# Patient Record
Sex: Male | Born: 1990 | Race: White | Hispanic: No | Marital: Married | Smoking: Current every day smoker
Health system: Southern US, Community
[De-identification: ages and names within clinical notes are randomized; demographics above are authoritative.]

## PROBLEM LIST (undated history)

## (undated) DIAGNOSIS — M25569 Pain in unspecified knee: Secondary | ICD-10-CM

## (undated) DIAGNOSIS — M549 Dorsalgia, unspecified: Secondary | ICD-10-CM

## (undated) DIAGNOSIS — T8859XA Other complications of anesthesia, initial encounter: Secondary | ICD-10-CM

## (undated) DIAGNOSIS — Z87442 Personal history of urinary calculi: Secondary | ICD-10-CM

## (undated) DIAGNOSIS — G8929 Other chronic pain: Secondary | ICD-10-CM

## (undated) DIAGNOSIS — N289 Disorder of kidney and ureter, unspecified: Secondary | ICD-10-CM

## (undated) DIAGNOSIS — T4145XA Adverse effect of unspecified anesthetic, initial encounter: Secondary | ICD-10-CM

## (undated) DIAGNOSIS — R7303 Prediabetes: Secondary | ICD-10-CM

## (undated) DIAGNOSIS — M5430 Sciatica, unspecified side: Secondary | ICD-10-CM

## (undated) HISTORY — PX: KNEE ARTHROPLASTY: SHX992

## (undated) HISTORY — PX: ANTERIOR CRUCIATE LIGAMENT REPAIR: SHX115

---
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
## 2008-01-04 ENCOUNTER — Emergency Department (HOSPITAL_COMMUNITY): Admission: EM | Admit: 2008-01-04 | Discharge: 2008-01-04 | Payer: Self-pay | Admitting: Emergency Medicine

## 2008-01-04 ENCOUNTER — Encounter: Payer: Self-pay | Admitting: Orthopedic Surgery

## 2008-01-10 ENCOUNTER — Ambulatory Visit: Payer: Self-pay | Admitting: Orthopedic Surgery

## 2008-01-10 ENCOUNTER — Encounter (INDEPENDENT_AMBULATORY_CARE_PROVIDER_SITE_OTHER): Payer: Self-pay | Admitting: *Deleted

## 2008-01-10 DIAGNOSIS — S83429A Sprain of lateral collateral ligament of unspecified knee, initial encounter: Secondary | ICD-10-CM

## 2008-01-10 DIAGNOSIS — S83419A Sprain of medial collateral ligament of unspecified knee, initial encounter: Secondary | ICD-10-CM

## 2008-01-10 DIAGNOSIS — S83509A Sprain of unspecified cruciate ligament of unspecified knee, initial encounter: Secondary | ICD-10-CM | POA: Insufficient documentation

## 2008-01-10 DIAGNOSIS — S83289A Other tear of lateral meniscus, current injury, unspecified knee, initial encounter: Secondary | ICD-10-CM

## 2008-01-11 ENCOUNTER — Encounter: Payer: Self-pay | Admitting: Orthopedic Surgery

## 2008-01-11 ENCOUNTER — Encounter (HOSPITAL_COMMUNITY): Admission: RE | Admit: 2008-01-11 | Discharge: 2008-02-10 | Payer: Self-pay | Admitting: Orthopedic Surgery

## 2008-01-18 ENCOUNTER — Ambulatory Visit: Payer: Self-pay | Admitting: Orthopedic Surgery

## 2008-02-02 ENCOUNTER — Ambulatory Visit: Payer: Self-pay | Admitting: Orthopedic Surgery

## 2008-02-02 ENCOUNTER — Observation Stay (HOSPITAL_COMMUNITY): Admission: RE | Admit: 2008-02-02 | Discharge: 2008-02-04 | Payer: Self-pay | Admitting: Orthopedic Surgery

## 2008-02-04 ENCOUNTER — Encounter: Payer: Self-pay | Admitting: Orthopedic Surgery

## 2008-02-06 ENCOUNTER — Ambulatory Visit: Payer: Self-pay | Admitting: Orthopedic Surgery

## 2008-02-07 ENCOUNTER — Encounter: Payer: Self-pay | Admitting: Orthopedic Surgery

## 2008-02-09 ENCOUNTER — Encounter (HOSPITAL_COMMUNITY): Admission: RE | Admit: 2008-02-09 | Discharge: 2008-03-10 | Payer: Self-pay | Admitting: Orthopedic Surgery

## 2008-02-09 ENCOUNTER — Encounter: Payer: Self-pay | Admitting: Orthopedic Surgery

## 2008-02-13 ENCOUNTER — Encounter: Payer: Self-pay | Admitting: Orthopedic Surgery

## 2008-02-27 ENCOUNTER — Ambulatory Visit: Payer: Self-pay | Admitting: Orthopedic Surgery

## 2008-03-11 ENCOUNTER — Encounter (HOSPITAL_COMMUNITY): Admission: RE | Admit: 2008-03-11 | Discharge: 2008-04-10 | Payer: Self-pay | Admitting: Orthopedic Surgery

## 2008-03-13 ENCOUNTER — Ambulatory Visit: Payer: Self-pay | Admitting: Orthopedic Surgery

## 2008-03-18 ENCOUNTER — Encounter: Payer: Self-pay | Admitting: Orthopedic Surgery

## 2008-04-12 ENCOUNTER — Encounter (HOSPITAL_COMMUNITY): Admission: RE | Admit: 2008-04-12 | Discharge: 2008-05-12 | Payer: Self-pay | Admitting: Orthopedic Surgery

## 2008-04-26 ENCOUNTER — Encounter: Payer: Self-pay | Admitting: Orthopedic Surgery

## 2008-04-29 ENCOUNTER — Ambulatory Visit: Payer: Self-pay | Admitting: Orthopedic Surgery

## 2008-05-14 ENCOUNTER — Encounter: Admission: RE | Admit: 2008-05-14 | Discharge: 2008-06-13 | Payer: Self-pay | Admitting: Orthopedic Surgery

## 2008-06-13 ENCOUNTER — Encounter: Payer: Self-pay | Admitting: Orthopedic Surgery

## 2008-07-05 ENCOUNTER — Encounter: Payer: Self-pay | Admitting: Orthopedic Surgery

## 2008-12-03 ENCOUNTER — Telehealth: Payer: Self-pay | Admitting: Orthopedic Surgery

## 2011-03-23 NOTE — H&P (Signed)
NAME:  Cory Davis, Cory Davis NO.:  [id number]   MEDICAL RECORD NO.:  [id number]          PATIENT TYPE:  AMB   LOCATION:  DAY                           FACILITY:  [facility]   PHYSICIAN:  Vickki Hearing, M.D.DATE OF BIRTH:  Dec 26, 1990   DATE OF ADMISSION:  DATE OF DISCHARGE:  LH                              HISTORY & PHYSICAL   CHIEF COMPLAINT:  Right knee torn anterior cruciate ligament.   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  He had a non  contact valgus load injury with immediate pain, swelling and audible  popping.  He could not ambulate on the knee and could not continue his  playing.  He was treated with physical therapy, bracing and regained his  range of motion and is now ready for surgery.   His MRI was on January 04, 2008, and read torn ACL, complex tear of the  posterior horn of the lateral meniscus.  Although clinically not  apparent on examination, his MRI also indicated impaction injuries to  the posterior lateral tibial plateau with bone bruises in the lateral  femoral condyle consistent with ACL tear.  There is low-level edema in  the posterior aspect the medial tibial plateau.  Moderate knee effusion.  There was abnormal signal in the posterior cruciate ligament.  There was  surrounding edema and amorphous soft tissue adjacent to the popliteus  tendon posterolateral corner including arcuate ligament and injury to  the popliteal fibular ligament that could not be excluded.   CLINICAL EXAM:  There are no lateral injuries.  There was some  tenderness in the lateral compartment, but nothing to suggest the  findings that were seen on the MRI other than the torn ACL and lateral  meniscal tear.  This will be evaluated with a complete exam under  anesthesia.   Past family history, social history and review of systems is as follows.  The patient has no known drug  allergies.  No medical problems.  No  previous surgery.  Negative family history.  Negative social history.  Review of systems negative times 10.   PHYSICAL EXAMINATION:  GENERAL:  Reveals a well-developed, well-  nourished athletic male with large body habitus who has no deformities,  normal grooming.  Gait is heel-toe bilaterally.  SKIN:  Intact with no scars, lesions, rashes, cafe au lait spots or  bruising.  CARDIOVASCULAR:  No swelling or varicose veins are noted.  Pulses and  temperature normal without edema or tenderness.  NEUROLOGICAL:  Motor exam shows normal strength in the quadriceps and  hamstrings as well as the ankle dorsiflexors and plantar flexors.  Reflexes are normal and symmetric in the patellar tendon and Achilles  tendon.  EXTREMITIES:  His upper extremity exam shows normal range of motion,  strength, stability and alignment.  The right knee examination was noted  for tenderness at the fibular attachment of the lateral collateral  ligament without any bruising or ecchymosis and no swelling.  There was  tenderness also on the lateral joint  line.  His flexion was 120 degrees  actively with -10 degrees extension.  Passive range of motion was -2 to  120.  He had a positive anterior drawer sign grade 2 and negative  posterior drawer sign, negative external rotation recurvatum sign,  positive Lachman sign grade 2, a pivot shift sign could not be exhibited  due to muscle guarding.  Had a negative sag sign.  The MCL and LCL were  stable at 0 and 30 degrees on two evaluations.  Dial test was negative  at 30 and 90 degrees.  Left knee was normal.   IMPRESSION:  1. Torn ACL (844.2), right knee.  2. Tear lateral meniscus (836.1).   The lateral collateral ligament complex will have to be reevaluated at  the time of surgery, but it appears to be clinically insignificant  despite the MRI findings.  Plan is to do a bone patellar tendon bone  anterior cruciate ligament  reconstruction of the right knee.   Informed consent was completed in the office.  We discussed the need to  do rehabilitation for the surgery to be successful.  The patient needs  to be highly motivated and participate as instructed.  Bleeding,  infection, neurovascular injury has been discussed.  The possibility of  having to do a lateral incision to repair the structures noted has been  discussed.  Patellofemoral pain has been discussed.  Re-rupture rates  and re-rupture possibility has been discussed.  Stiffness and lack of  extension has been discussed.   The patient agrees to surgery.      Vickki Hearing, M.D.  Electronically Signed     SEH/MEDQ  D:  02/01/2008  T:  02/01/2008  Job:  [id number]   cc:   [facility]

## 2011-03-23 NOTE — Discharge Summary (Signed)
NAME:  MERL, BOMMARITO NO.:  [id number]   MEDICAL RECORD NO.:  [id number]          PATIENT TYPE:  OBV   LOCATION:  A322                          FACILITY:  [facility]   PHYSICIAN:  Vickki Hearing, M.D.DATE OF BIRTH:  Mar 22, 1991   DATE OF ADMISSION:  02/02/2008  DATE OF DISCHARGE:  03/29/2009LH                               DISCHARGE SUMMARY   ADMITTING DIAGNOSIS:  ACL (anterior cruciate ligament) and lateral  meniscal tear right knee.   DISCHARGE DIAGNOSIS:  ACL (anterior cruciate ligament) and lateral  meniscal tear right knee.   HISTORY:  An 20 year old male was injured January 04, 2008, playing  basketball.  Had a noncontact valgus load injury with immediate pain,  swelling, popping and inability to continue participation in sporting  event.  MRI showed torn ACL, complex tear of the posterior horn lateral  meniscus and suggestions of posterolateral corner and lateral collateral  ligament injuries which did not pan out during clinical exam under  anesthesia.   The patient was admitted for tertiary diagnosis of pain and poor pain  control after surgery.   Pain now under control with Vicodin, ibuprofen.   HOSPITAL COURSE:  After admission on the day of surgery February 02, 2008,  had an uncomplicated bone patellar tendon bone autograft ACL  reconstruction with a partial lateral meniscectomy.  He had an exam  under anesthesia which revealed normal collateral ligament and  posterolateral structures.  There was a grade II Lachman and grade II  pivot.  His lateral meniscus was shredded and torn as was his ACL.  There was a medial plica, patellofemoral joint and medial structures had  normal articular surfaces and no other damage was noted.   After surgery he went to the PACU.  We could not get his pain under  control and therefore he was admitted.   On postop day #1 he was still having significant pain described as  throbbing.  Ibuprofen was added and his pain  was then controlled.  He  was ambulatory with crutches and a brace and was allowed to be  discharged on postop day #2.   DISCHARGE MEDICINES:  1. Phenergan 20 q.6 h. p.r.n. for pain.  2. Vicodin 1-2 q.4 h. p.r.n. for pain.  3. Ibuprofen 800 mg q.6-8 h. p.r.n. for pain.   The patient will follow up on Tuesday, March 31.  At that time we will  do a dressing change, start our order is physical therapy and continuous  postop care from there.      Vickki Hearing, M.D.  Electronically Signed     SEH/MEDQ  D:  02/04/2008  T:  02/04/2008  Job:  [id number]

## 2011-03-23 NOTE — Op Note (Signed)
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  He had a noncontact valgus load injury with  immediate pain, immediate audible pop and swelling, could not ambulate  and could not finish playing.  After physical therapy and bracing to  regain motion, he presented for surgery with an ACL tear and a complex  tear of the posterior horn of the lateral meniscus.  There was some MRI  evidence of lateral structure damage, which was evaluated at surgery.   PREOPERATIVE DIAGNOSES:  1. Anterior cruciate ligament tear, right knee.  2. Lateral meniscal tear, right knee.  3. Possible posterolateral corner and lateral collateral ligament      injury.   POSTOPERATIVE DIAGNOSES:  1. Anterior cruciate ligament tear.  2. Lateral meniscal tear.   PROCEDURES:  1. Arthroscopic anterior cruciate ligament reconstruction with a bone-      patellar tendon-bone autograft.  2. Lateral meniscectomy.   Exam under anesthesia revealed 2+ Lachman, grade 2 pivot, normal lateral  collateral ligaments and normal posterolateral corner.   Assisted by Cecile Sheerer.   FINDINGS:  Severely torn and ruptured, shredded ACL.  Complex tear of  the posterior horn of the lateral meniscus involving the entire  posterior horn except for the peripheral rim.  Medial plica.  Normal  patellofemoral joint.  Normal medial structures, including cartilage.   BLOOD LOSS:  Minimal.   COMPLICATIONS:  None.   COUNTS:  Correct.   The patient was done under general anesthetic.  The patient went to PACU  in good condition.   DETAILS  OF PROCEDURE:  Mr. Goddard was identified preoperatively.  His  right knee was marked as the surgical site.  I countersigned it.  I  updated history and physical.  He was taken to surgery, given general  anesthesia and antibiotics.  He had his exam under anesthesia, which was  noted above.   The right knee was prepped with sterile DuraPrep, draped sterilely.  A  time-out procedure was completed.   Graft was taken first.  A midline incision was made after the time-out  procedure was completed.  A tourniquet was elevated to 300 mmHg prior to  incision.   Incision was taken down to the paratenon.  Paratenon was opened.  The  middle third of the patellar tendon was identified.  The patellar width  was 30 mm.  We took the 10 central millimeters as the graft with 25 and  20-mm bone blocks taken with an oscillating saw.  The graft was repaired  on the back table and held on a tension board with a moist gauze.   Arthroscopic procedure was then started through a lateral portal.  Medial portal was established  with the assistance of a spinal needle.   Diagnostic arthroscopy was performed.   Through the medial portal, a probe was used to probe the intra-articular  structures.  Lateral meniscal tear was noted.  Lateral meniscal tear was  resected with a combination of baskets, forceps and shavers.   The notch was prepared with a small notchplasty just for visualization.  Soft tissue was removed from the notch.  ACL rupture was removed.  Over-  the-top position was identified with a probe and visually.   N plus 7 rule was used to set the tibial tunnel angle.  The tibial guide  was set in the posterior aspect of the ACL footprint using medial tibial  spine and lateral meniscus as reference as well as PCL.   A pin was drilled into the joint 30 degrees off the midline.  The tunnel  was drilled as an 11-mm tunnel.  The tunnel was rasped.  Subperiosteal dissection on the medial face the tibia was  used to  elevate the soft tissue and periosteum there.   Through a medial tunnel, a 10:30 position on the lateral femoral wall  was chosen.  Pin was drilled out the anterolateral thigh with a slightly  more lateral exit site.  A tunnel was drilled to a depth of 30 mm.   Graft was then passed through the tibial tunnel and secured by a 7 x  22.5 bio-screw.  Tibial side was secured in extension with a 10 x 28  screw.  Prior to passing the tibial screw, we cycled the knee, pulled on  the distal sutures to ensure graft fixation proximally.  We then secured  the tibia, went back into the joint, evaluated the graft.  It was in  good position, had good clearance on extension, had good tension, and a  Lachman test at the end of the procedure was normal.   The wounds were closed by bone-grafting the patella, closing the  patellar tendon defect with 0 Monocryl suture in running fashion, subcu  tissue closed with 0 Monocryl in running fashion.   Thirty m left flank of Marcaine injected around the wound and then 30  into the joint.  Pain pump catheter was placed prior to skin closure.  Staples were used to close the skin.  Dressings were applied, the  tourniquet was released, Cryo/Cuff was applied, brace was applied with  the knee locked in extension.   The patient will be evaluated in the recovery room to determine his  discharge.      Vickki Hearing, M.D.  Electronically Signed     SEH/MEDQ  D:  02/02/2008  T:  02/03/2008  Job:  [id number]

## 2011-08-02 LAB — HEMOGLOBIN AND HEMATOCRIT, BLOOD: HCT: 41.6

## 2016-03-04 ENCOUNTER — Other Ambulatory Visit (HOSPITAL_COMMUNITY): Payer: Self-pay | Admitting: Preventative Medicine

## 2016-03-04 DIAGNOSIS — R9389 Abnormal findings on diagnostic imaging of other specified body structures: Secondary | ICD-10-CM

## 2016-03-07 ENCOUNTER — Emergency Department (HOSPITAL_COMMUNITY)

## 2016-03-07 ENCOUNTER — Emergency Department (HOSPITAL_COMMUNITY)
Admission: EM | Admit: 2016-03-07 | Discharge: 2016-03-07 | Disposition: A | Discharge status: Home / Self Care | Attending: Emergency Medicine | Admitting: Emergency Medicine

## 2016-03-07 ENCOUNTER — Encounter (HOSPITAL_COMMUNITY): Payer: Self-pay | Admitting: Emergency Medicine

## 2016-03-07 DIAGNOSIS — S8992XA Unspecified injury of left lower leg, initial encounter: Secondary | ICD-10-CM

## 2016-03-07 DIAGNOSIS — F17210 Nicotine dependence, cigarettes, uncomplicated: Secondary | ICD-10-CM | POA: Insufficient documentation

## 2016-03-07 DIAGNOSIS — Y999 Unspecified external cause status: Secondary | ICD-10-CM | POA: Insufficient documentation

## 2016-03-07 DIAGNOSIS — Y939 Activity, unspecified: Secondary | ICD-10-CM | POA: Insufficient documentation

## 2016-03-07 DIAGNOSIS — S8991XA Unspecified injury of right lower leg, initial encounter: Secondary | ICD-10-CM | POA: Diagnosis present

## 2016-03-07 DIAGNOSIS — Y929 Unspecified place or not applicable: Secondary | ICD-10-CM | POA: Insufficient documentation

## 2016-03-07 DIAGNOSIS — X501XXA Overexertion from prolonged static or awkward postures, initial encounter: Secondary | ICD-10-CM | POA: Diagnosis not present

## 2016-03-07 DIAGNOSIS — Z79899 Other long term (current) drug therapy: Secondary | ICD-10-CM | POA: Diagnosis not present

## 2016-03-07 HISTORY — DX: Prediabetes: R73.03

## 2016-03-07 MED ORDER — DICLOFENAC SODIUM 50 MG PO TBEC: 50.0000 mg | DELAYED_RELEASE_TABLET | Freq: Two times a day (BID) | ORAL | Status: DC

## 2016-03-07 MED ORDER — HYDROCODONE-ACETAMINOPHEN 5-325 MG PO TABS: 1.0000 | ORAL_TABLET | ORAL | Status: DC | PRN

## 2016-03-07 NOTE — ED Notes (Signed)
Pt has crutches at home, [facility] notified and is ok with pt using crutches he already has.

## 2016-03-07 NOTE — ED Provider Notes (Signed)
CSN: [id number]     Arrival date & time 03/07/16  1627 History  By signing my name below, I, Evon Slackerrance Branch, attest that this documentation has been prepared under the direction and in the presence of [facility] Orlene OchM Kashif Pooler, NP. Electronically Signed: Evon Slackerrance Branch, ED Scribe. 03/07/2016. 5:39 PM.      Chief Complaint  Patient presents with  . Knee Pain   Patient is a 25 y.o. male presenting with knee pain. The history is provided by the patient. No language interpreter was used.  Knee Pain Location:  Knee Time since incident:  1 day Injury: yes   Knee location:  R knee Pain details:    Radiates to:  Does not radiate   Onset quality:  Sudden   Duration:  1 day   Timing:  Constant Prior injury to area:  Yes Relieved by:  Nothing Worsened by:  Bearing weight, flexion and extension Ineffective treatments:  None tried Associated symptoms: swelling   Associated symptoms: no numbness and no tingling    HPI Comments: Cory Davis is a 25 y.o. male who presents to the Emergency Department complaining of rigth knee injury onset 1 day prior. Pt reports associated swelling. Pt states that he injured the knee when standing from the kneeling position. Pt states that he felt the knee pop. Pt report Hx of ACL and MCL repair 8 years prior. He reports that the pain feels similar to when he tore his ACL. He states that the pain is worse when bearing weight or ambulating. Pt denies any medications PTA. Pt denies numbness or tingling.  Past Medical History  Diagnosis Date  . Prediabetes    Past Surgical History  Procedure Laterality Date  . Anterior cruciate ligament repair     Family History  Problem Relation Age of Onset  . Diabetes Other    Social History  Substance Use Topics  . Smoking status: Current Every Day Smoker -- 0.50 packs/day for 10 years    Types: Cigarettes  . Smokeless tobacco: Never Used  . Alcohol Use: No    Review of Systems  Musculoskeletal: Positive for joint  swelling and arthralgias.       Right knee pain  All other systems reviewed and are negative.    Allergies  Review of patient's allergies indicates no known allergies.  Home Medications   Prior to Admission medications   Medication Sig Start Date End Date Taking? Authorizing Provider  gabapentin (NEURONTIN) 300 MG capsule Take 300 mg by mouth 3 (three) times daily. 01/24/16  Yes Historical Provider, MD  traMADol (ULTRAM) 50 MG tablet Take 50 mg by mouth 3 (three) times daily as needed. 02/25/16  Yes Historical Provider, MD  diclofenac (VOLTAREN) 50 MG EC tablet Take 1 tablet (50 mg total) by mouth 2 (two) times daily. 03/07/16   Payten Hobin Orlene OchM Eleena Grater, NP  HYDROcodone-acetaminophen (NORCO/VICODIN) 5-325 MG tablet Take 1 tablet by mouth every 4 (four) hours as needed. 03/07/16   Kamera Dubas Orlene OchM Chae Shuster, NP   BP 125/62 mmHg  Pulse 78  Temp(Src) 99 F (37.2 C) (Oral)  Resp 15  Ht 6\' 2"  (1.88 m)  Wt 102.059 kg  BMI 28.88 kg/m2  SpO2 100%   Physical Exam  Constitutional: He is oriented to person, place, and time. He appears well-developed and well-nourished. No distress.  HENT:  Head: Normocephalic and atraumatic.  Eyes: Conjunctivae and EOM are normal.  Neck: Normal range of motion. Neck supple. No tracheal deviation present.  Cardiovascular: Normal rate and  intact distal pulses.   Pulses:      Dorsalis pedis pulses are 2+ on the right side.  Pulmonary/Chest: Effort normal. No respiratory distress.  Musculoskeletal:       Right knee: He exhibits swelling. He exhibits normal range of motion, no deformity, no laceration, no erythema, normal alignment and normal patellar mobility. Tenderness found. LCL tenderness noted.  Right knee is swollen, tender on lateral aspect, able to flex and extend the knee without difficulty. Pedal pulses 2+, adequate circulation and good strength.   Neurological: He is alert and oriented to person, place, and time.  Skin: Skin is warm and dry.  Psychiatric: He has a normal  mood and affect. His behavior is normal.  Nursing note and vitals reviewed.   ED Course  Procedures (including critical care time) DIAGNOSTIC STUDIES: Oxygen Saturation is 99% on RA, normal by my interpretation.    COORDINATION OF CARE: 6:17 PM-Discussed treatment plan with pt at bedside and pt agreed to plan.     Labs Review Labs Reviewed - No data to display  Imaging Review Dg Knee Complete 4 Views Right  03/07/2016  CLINICAL DATA:  25 year old male complaining of pain and swelling in the right knee after kneeling down yesterday. Reportedly, felt a pop at that time. Knee surgery 8 years ago. EXAM: RIGHT KNEE - COMPLETE 4+ VIEW COMPARISON:  No priors. FINDINGS: There appears to be a small osseous fragment in the lateral compartment of the knee joint. Postoperative changes of prior ACL repair are noted. Large suprapatellar effusion. IMPRESSION: 1. Small loose body in the lateral compartment, which appears represent a new bony fragment. The exact donor site of this is uncertain, but this is associated with an effusion. This could be better evaluated with followup nonemergent MRI of the knee with and without IV gadolinium. Electronically Signed   By: Trudie Reed M.D.   On: 03/07/2016 17:25   I have personally reviewed and evaluated these images as part of my medical decision-making.   MDM  25 y.o. male with right knee pain s/p injury yesterday stable for d/c without focal neuro deficits. Discussed in detail with the patient x-ray results and need for f/u with ortho for further evaluation and possible MRI. Knee immobilizer applied, crutches, ice, elevation and pain management. Patient will call Dr. Romeo Apple (who did his previous surgery) tomorrow for follow up.   Final diagnoses:  Knee injury, left, initial encounter   I personally performed the services described in this documentation, which was scribed in my presence. The recorded information has been reviewed and is accurate.       [place], [place] 03/07/16 2051  Raeford Razor, MD 03/10/16 [phone]

## 2016-03-07 NOTE — ED Notes (Signed)
Pt given ice pack

## 2016-03-07 NOTE — ED Notes (Signed)
Pt made aware to return if symptoms worsen or if any life threatening symptoms occur.   

## 2016-03-07 NOTE — ED Notes (Signed)
Patient c/o right knee pain. Per patient was in squatting position and when he went to stand up knee twisted and made a popping noise. Per patient same sound and pain as when he previously tore ACL in same knee. Patient ambulating with limp.

## 2016-03-08 ENCOUNTER — Telehealth: Payer: Self-pay | Admitting: Orthopedic Surgery

## 2016-03-08 NOTE — Telephone Encounter (Signed)
Patient called following visit to [facility] 03/07/16 for right knee injury.  Patient had surgery by Dr Romeo AppleHarrison on this knee in 2012.  States just moved back to the area. Offered appointment; however, upon verifying his [employer] plan at [phone]#[phone], per Eustaquio MaizeShanae, Dr Romeo AppleHarrison, as well as our other provider, Dr Hilda [facility]Keeling, [facility].  Called back to patient to further discuss.  He will contact insurer, [employer] standard, and review benefits - (his plan indicates [employer] as non-participating, although no referral needed).

## 2016-03-11 ENCOUNTER — Ambulatory Visit (HOSPITAL_COMMUNITY)

## 2016-03-18 ENCOUNTER — Ambulatory Visit: Admission: RE | Admit: 2016-03-18 | Source: Ambulatory Visit

## 2016-03-18 ENCOUNTER — Ambulatory Visit (HOSPITAL_COMMUNITY)
Admission: RE | Admit: 2016-03-18 | Discharge: 2016-03-18 | Disposition: A | Discharge status: Home / Self Care | Source: Ambulatory Visit | Attending: Preventative Medicine | Admitting: Preventative Medicine

## 2016-03-18 DIAGNOSIS — R938 Abnormal findings on diagnostic imaging of other specified body structures: Secondary | ICD-10-CM | POA: Diagnosis present

## 2016-03-18 DIAGNOSIS — R9389 Abnormal findings on diagnostic imaging of other specified body structures: Secondary | ICD-10-CM

## 2016-04-23 ENCOUNTER — Ambulatory Visit (HOSPITAL_COMMUNITY): Attending: Orthopedic Surgery | Admitting: Physical Therapy

## 2016-04-23 DIAGNOSIS — Z9181 History of falling: Secondary | ICD-10-CM

## 2016-04-23 DIAGNOSIS — M25661 Stiffness of right knee, not elsewhere classified: Secondary | ICD-10-CM | POA: Diagnosis present

## 2016-04-23 DIAGNOSIS — M6281 Muscle weakness (generalized): Secondary | ICD-10-CM | POA: Diagnosis present

## 2016-04-23 DIAGNOSIS — M25561 Pain in right knee: Secondary | ICD-10-CM | POA: Diagnosis present

## 2016-04-23 NOTE — Therapy (Signed)
[facility] [address] [place], [place], [address] Phone: [phone]   Fax:  [phone]  Physical Therapy Evaluation  Patient Details  Name: Cory Davis MRN: [id number] Date of Birth: 1991-09-09 Referring Provider: Viviann SpareSteven Case  Encounter Date: 04/23/2016      PT End of Session - 04/23/16 1505    Visit Number 1   Number of Visits 18   Date for PT Re-Evaluation 05/23/16   Authorization Type tricare (therapist only)   Authorization - Visit Number 1   Authorization - Number of Visits 18   PT Start Time 1435   PT Stop Time 1515   PT Time Calculation (min) 40 min   Activity Tolerance Patient tolerated treatment well      Past Medical History  Diagnosis Date  . Prediabetes     Past Surgical History  Procedure Laterality Date  . Anterior cruciate ligament repair      There were no vitals filed for this visit.       Subjective Assessment - 04/23/16 1451    Subjective Mr. Sunny SchleinWooten states that he went to stand up from a squatted position on 03/07/2016 and heard a "pop" in his Rt knee he had ACL surgery in his knee in 2009;  He had torn his meniscus and had bone fragments.  He had arthroscoopic surgery on 04/09/16. He is now being referred for skilled physical therapy. He states that as long as stays moving his leg does not bother him.  When he sits the knee begins to ache. .    Pertinent History Rt ACL repain 2009    How long can you sit comfortably? no problem    How long can you stand comfortably? Pt will put the weight on his left leg    How long can you walk comfortably? 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 Yes   Pain Score 4   as high as an 8   Pain Orientation Right   Pain Descriptors / Indicators Aching;Shooting   Pain Type Surgical pain   Pain Radiating Towards none   Pain Onset 1 to 4 weeks ago   Pain  Frequency Constant   Aggravating Factors  rest   Pain Relieving Factors walking             [facility] PT Assessment - 04/23/16 0001    Assessment   Medical Diagnosis Rt menisectomy, removal of bone fragments.    Referring Provider Viviann SpareSteven Case   Onset Date/Surgical Date 04/09/16   Next MD Visit 05/20/2016   Prior Therapy not for this issue    Precautions   Precautions Knee   Required Braces or Orthoses Other Brace/Splint  Donjoy is on order.    Restrictions   Weight Bearing Restrictions No   Balance Screen   Has the patient fallen in the past 6 months Yes   How many times? 4   Has the patient had a decrease in activity level because of a fear of falling?  Yes   Is the patient reluctant to leave their home because of a fear of falling?  No   Home Environment   Living Environment Private residence   Type of Home Apartment   Prior Function   Level of Independence Independent   Vocation Full time employment   Public librarianVocation Requirements welder on knees, crawling ,  ladders    Leisure golf    Cognition   Overall Cognitive Status Within Functional Limits for tasks assessed   Observation/Other Assessments   Focus on Therapeutic Outcomes (FOTO)  30   Functional Tests   Functional tests Single leg stance   Single Leg Stance   Comments RT; 23   LT: 60 seconds    ROM / Strength   AROM / PROM / Strength Strength;AROM   AROM   AROM Assessment Site Knee   Right/Left Knee Right   Right Knee Extension 3   Right Knee Flexion 103   Strength   Strength Assessment Site Hip;Knee;Ankle   Right/Left Hip Right   Right Hip Flexion 2/5   Right Hip Extension 4-/5   Right Hip ABduction 4/5   Right/Left Knee Right   Right Knee Flexion 3+/5   Right Knee Extension 3/5   Right/Left Ankle Right   Right Ankle Dorsiflexion 3/5                   [facility] Adult PT Treatment/Exercise - 04/23/16 0001    Exercises   Exercises Knee/Hip   Knee/Hip Exercises: Seated   Long Arc Quad 10 reps    Other Seated Knee/Hip Exercises Ankle dorisflexion x 10    Knee/Hip Exercises: Supine   Quad Sets 10 reps   Straight Leg Raises 10 reps   Knee/Hip Exercises: Prone   Hamstring Curl 10 reps   Hamstring Curl Limitations 3   Hip Extension 10 reps                PT Education - 04/23/16 1504    Education provided Yes   Education Details HEP   Person(s) Educated Patient   Methods Explanation;Verbal cues;Handout;Tactile cues   Comprehension Verbalized understanding;Returned demonstration          PT Short Term Goals - 04/23/16 1521    PT SHORT TERM GOAL #1   Title Pt to ambulate with an normal heel to toe gait pattern   Time 1   Period Weeks   Status New   PT SHORT TERM GOAL #2   Title Pt strength to increase one grade to be able to ascend and descend steps with ease.    Time 3   Period Weeks   Status New   PT SHORT TERM GOAL #3   Title Pt to be able to single leg stance on Rt LE for 40 seconds to decrease risk of falls    Time 3   Period Weeks   Status New   PT SHORT TERM GOAL #4   Title Pt pain level to be no greater than a 4/10 to allow pt to walk for an hour without discomfort    Time 3   Period Weeks   Status New           PT Long Term Goals - 04/23/16 1524    PT LONG TERM GOAL #1   Title Pt strength to be increased by one grade in his Rt LE to allow patient to squat to the groung and return without difficulty,.   Time 6   Period Weeks   Status New   PT LONG TERM GOAL #2   Title PT to be able to single leg stance on Rt LE for 60 seconds to allow pt ambulate on uneven terrain in confidence.    Time 6   Period Weeks   Status New   PT LONG TERM GOAL #3   Title  Pt pain level to be no greater than a 1/10 in his right knee to allow pt to return to work.    Time 6   Period Weeks   Status New               Plan - 04/23/16 1506    Clinical Impression Statement Mr. Rouillard is a 25 yo male who had an ACL repair in 2009.  He was helping to move a  couch, squatted down and his knee buckled.  He had arthroscopic knee surgery on 04/09/2016 with bone fragments repaired as well as a medial menisectomy.  He is now being referred to skilled therapy to maximize his functional level.  Examination demonstrates decreased ROM, decreased balance, decreased strength , decreased activity tolerancce and increased pain.  Mr. Haselton will benefit from skilled PT to address these issues and return him to his prior level of function.    Rehab Potential Good   PT Frequency 3x / week   PT Duration 6 weeks   PT Treatment/Interventions ADLs/Self Care Home Management;Gait training;Stair training;Functional mobility training;Therapeutic activities;Therapeutic exercise;Balance training;Patient/family education;Manual techniques;Cryotherapy;Electrical Stimulation;Ultrasound;Passive range of motion   PT Next Visit Plan Begin closed chain exercises including rocekerboard, heel raises, minisquats, sit to stand,  lunges    PT Home Exercise Plan given    Consulted and Agree with Plan of Care Patient      Patient will benefit from skilled therapeutic intervention in order to improve the following deficits and impairments:  Abnormal gait, Decreased activity tolerance, Decreased balance, Decreased strength, Difficulty walking, Decreased range of motion, Pain, Increased edema  Visit Diagnosis: Muscle weakness (generalized) - Plan: PT plan of care cert/re-cert  History of falling - Plan: PT plan of care cert/re-cert  Stiffness of right knee, not elsewhere classified - Plan: PT plan of care cert/re-cert  Pain in right knee - Plan: PT plan of care cert/re-cert     Problem List Patient Active Problem List   Diagnosis Date Noted  . TEAR LATERAL MENISCUS 01/10/2008  . L C L SPRAIN 01/10/2008  . M C L SPRAIN 01/10/2008  . TEAR A Barrett Shell 01/10/2008    Virgina Organ, PT CLT [phone] 04/23/2016, 3:35 PM  [facility] [address] [place], [place], [address] Phone: [phone]   Fax:  [phone]  Name: ASENCION LOVEDAY MRN: [id number] Date of Birth: 08-02-1991

## 2016-04-23 NOTE — Patient Instructions (Addendum)
Toe Raise (Sitting)    Raise toes, keeping heels on floor. Repeat 10____ times per set. Do __1__ sets per session. Do __2__ sessions per day.  http://orth.exer.us/46   CKnee Extension (Sitting)    Place __2__ pound weight on right ankle and straighten knee fully, lower slowly. Repeat _10___ times per set. Do __1__ sets per session. Do _2___ sessions per day.  http://orth.exer.us/732   Copyright  VHI. All rights reserved.  Strengthening: Quadriceps Set    Tighten muscles on top of thighs by pushing knees down into surface. Hold _5___ seconds. Repeat _10___ times per set. Do __1__ sets per session. Do ___2_ sessions per day.  http://orth.exer.us/602   Copyright  VHI. All rights reserved.  Strengthening: Straight Leg Raise (Phase 1)    Tighten muscles on front of right thigh, then lift leg _15___ inches from surface, keeping knee locked.  Repeat _10___ times per set. Do ___1_ sets per session. Do __3_ sessions per day.  http://orth.exer.us/614   Copyright  VHI. All rights reserved.  Self-Mobilization: Knee Flexion (Prone)   With 3-4 #  Bring right heel toward buttocks as close as possible. Hold _3___ seconds. Relax. Repeat _10___ times per set. Do __1__ sets per session. Do _3___ sessions per day.  http://orth.exer.us/596   Copyright  VHI. All rights reserved.  Strengthening: Hip Extension (Prone)    Tighten muscles on front of left thigh, then lift leg ___2_ inches from surface, keeping knee locked. Repeat 10____ times per set. Do ____ sets per session. Do _2___ sessions per day. 1 http://orth.exer.us/620   Copyright  VHI. All rights reserved.  Self-Mobilization: Heel Slide (Supine)    Slide right  heel toward buttocks until a gentle stretch is felt. Hold _5___ seconds. Relax. Repeat __10__ times per set. Do _1___ sets per session. Do ___3_ sessions per day.  http://orth.exer.us/710   Copyright  VHI. All rights reserved.

## 2016-04-26 ENCOUNTER — Ambulatory Visit (HOSPITAL_COMMUNITY): Admitting: Physical Therapy

## 2016-04-26 DIAGNOSIS — M25661 Stiffness of right knee, not elsewhere classified: Secondary | ICD-10-CM

## 2016-04-26 DIAGNOSIS — M6281 Muscle weakness (generalized): Secondary | ICD-10-CM

## 2016-04-26 DIAGNOSIS — Z9181 History of falling: Secondary | ICD-10-CM

## 2016-04-26 DIAGNOSIS — M25561 Pain in right knee: Secondary | ICD-10-CM

## 2016-04-26 NOTE — Therapy (Signed)
[employer] [facility] [address] [place], [place], [address] Phone: [phone]   Fax:  [phone]  Physical Therapy Treatment  Patient Details  Name: Cory Davis MRN: [id number] Date of Birth: 02-12-1991 Referring Provider: Viviann SpareSteven Case  Encounter Date: 04/26/2016      PT End of Session - 04/26/16 1740    Visit Number 2   Number of Visits 18   Date for PT Re-Evaluation 05/23/16   Authorization Type tricare (therapist only)   Authorization - Visit Number 2   Authorization - Number of Visits 18   PT Start Time 1736   PT Stop Time 1817   PT Time Calculation (min) 41 min   Activity Tolerance Patient tolerated treatment well   Behavior During Therapy [facility] for tasks assessed/performed      Past Medical History  Diagnosis Date  . Prediabetes     Past Surgical History  Procedure Laterality Date  . Anterior cruciate ligament repair      There were no vitals filed for this visit.      Subjective Assessment - 04/26/16 1738    Subjective Pt stated knee swollen today, pain scale 3/10.  He has been icing his knee in the evenings after dinner up until he goes to bed.    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 Yes   Pain Score 3    Pain Location Knee   Pain Orientation Right   Pain Descriptors / Indicators Aching   Pain Type Surgical pain   Pain Radiating Towards none   Pain Onset 1 to 4 weeks ago   Pain Frequency Constant   Aggravating Factors  rest   Pain Relieving Factors walking   Effect of Pain on Daily Activities henders from completeing tasks                         [facility] Treatment/Exercise - 04/26/16 0001    Knee/Hip Exercises: Standing   Rocker Board 2 minutes  R/L and A/P   Knee/Hip Exercises: Seated   Long Arc Quad 2 sets;20 reps  10#   Knee/Hip Exercises: Supine    Bridges Limitations x20 reps    Straight Leg Raises 15 reps;Right   Other Supine Knee/Hip Exercises straight leg bridge x20 reps (4" box)                 PT Education - 04/26/16 1819    Education provided Yes   Education Details Reviewed goals; updated HEP   Person(s) Educated Patient   Methods Explanation;Demonstration;Handout   Comprehension Verbalized understanding;Returned demonstration          PT Short Term Goals - 04/23/16 1521    PT SHORT TERM GOAL #1   Title Pt to ambulate with an normal heel to toe gait pattern   Time 1   Period Weeks   Status New   PT SHORT TERM GOAL #2   Title Pt strength to increase one grade to be able to ascend and descend steps with ease.    Time 3   Period Weeks   Status New   PT SHORT TERM GOAL #3   Title Pt to be able to single leg stance on Rt LE for 40 seconds to decrease risk of falls    Time 3   Period  Weeks   Status New   PT SHORT TERM GOAL #4   Title Pt pain level to be no greater than a 4/10 to allow pt to walk for an hour without discomfort    Time 3   Period Weeks   Status New           PT Long Term Goals - 04/23/16 1524    PT LONG TERM GOAL #1   Title Pt strength to be increased by one grade in his Rt LE to allow patient to squat to the groung and return without difficulty,.   Time 6   Period Weeks   Status New   PT LONG TERM GOAL #2   Title PT to be able to single leg stance on Rt LE for 60 seconds to allow pt ambulate on uneven terrain in confidence.    Time 6   Period Weeks   Status New   PT LONG TERM GOAL #3   Title Pt pain level to be no greater than a 1/10 in his right knee to allow pt to return to work.    Time 6   Period Weeks   Status New               Plan - 04/26/16 1820    Clinical Impression Statement Today's session focused on therex to address RLE strength. Pt demonstrating good quad contraction during SLR, however I noted fatigue with muscle shaking and minor extensor lag after  several reps. Pt reports his HEP is fairly easy, so it was updated with him demonstrating good understanding of correct technique. Will continue with current POC.    Rehab Potential Good   PT Frequency 3x / week   PT Duration 6 weeks   PT Treatment/Interventions ADLs/Self Care Home Management;Gait training;Stair training;Functional mobility training;Therapeutic activities;Therapeutic exercise;Balance training;Patient/family education;Manual techniques;Cryotherapy;Electrical Stimulation;Ultrasound;Passive range of motion   PT Next Visit Plan Begin closed chain exercises including rocekerboard, heel raises, minisquats, sit to stand,  lunges    PT Home Exercise Plan removed seated DF/hamstring curl, added straight leg bridge and encouraged increased weight at home with LAQ   Consulted and Agree with Plan of Care Patient      Patient will benefit from skilled therapeutic intervention in order to improve the following deficits and impairments:  Abnormal gait, Decreased activity tolerance, Decreased balance, Decreased strength, Difficulty walking, Decreased range of motion, Pain, Increased edema  Visit Diagnosis: Muscle weakness (generalized)  History of falling  Stiffness of right knee, not elsewhere classified  Pain in right knee     Problem List Patient Active Problem List   Diagnosis Date Noted  . TEAR LATERAL MENISCUS 01/10/2008  . L C L SPRAIN 01/10/2008  . M C L SPRAIN 01/10/2008  . TEAR A C L 01/10/2008   6:23 PM,04/26/2016 Marylyn Ishihara PT, DPT [facility] [phone]  [facility] [address] [place], [place], [address] Phone: [phone]   Fax:  [phone]  Name: Cory Davis MRN: [id number] Date of Birth: 04-Aug-1991

## 2016-04-28 ENCOUNTER — Ambulatory Visit (HOSPITAL_COMMUNITY)

## 2016-04-30 ENCOUNTER — Telehealth (HOSPITAL_COMMUNITY): Payer: Self-pay | Admitting: Physical Therapy

## 2016-04-30 ENCOUNTER — Ambulatory Visit (HOSPITAL_COMMUNITY): Admitting: Physical Therapy

## 2016-04-30 NOTE — Telephone Encounter (Signed)
Called pt re appointment.  No answer.  [name], PT CLT [phone]

## 2016-04-30 NOTE — Telephone Encounter (Signed)
Pt did not make appointment. Left message re next appointment is on 6/26/ at 10:30  Virgina OrganCynthia Daylin Gruszka, PT CLT [phone]

## 2016-05-03 ENCOUNTER — Ambulatory Visit (HOSPITAL_COMMUNITY): Admitting: Physical Therapy

## 2016-05-03 ENCOUNTER — Telehealth (HOSPITAL_COMMUNITY): Payer: Self-pay | Admitting: Physical Therapy

## 2016-05-03 DIAGNOSIS — M25661 Stiffness of right knee, not elsewhere classified: Secondary | ICD-10-CM

## 2016-05-03 DIAGNOSIS — M25561 Pain in right knee: Secondary | ICD-10-CM

## 2016-05-03 DIAGNOSIS — M6281 Muscle weakness (generalized): Secondary | ICD-10-CM | POA: Diagnosis not present

## 2016-05-03 DIAGNOSIS — Z9181 History of falling: Secondary | ICD-10-CM

## 2016-05-03 NOTE — Therapy (Signed)
[facility] [address] [place], [place], [address] Phone: [phone]   Fax:  [phone]  Physical Therapy Treatment  Patient Details  Name: Cory Davis MRN: [id number] Date of Birth: 26-Jun-1991 Referring Provider: Viviann SpareSteven Case  Encounter Date: 05/03/2016      PT End of Session - 05/03/16 1511    Visit Number 3   Number of Visits 18   Date for PT Re-Evaluation 05/23/16   Authorization Type tricare (therapist only)   Authorization - Visit Number 3   Authorization - Number of Visits 18   PT Start Time 1435   PT Stop Time 1515   PT Time Calculation (min) 40 min   Activity Tolerance Patient tolerated treatment well   Behavior During Therapy [facility] for tasks assessed/performed      Past Medical History  Diagnosis Date  . Prediabetes     Past Surgical History  Procedure Laterality Date  . Anterior cruciate ligament repair      There were no vitals filed for this visit.      Subjective Assessment - 05/03/16 1440    Subjective No pain just some aching here and there.    Pertinent History Rt ACL repain 2009    How long can you sit comfortably? no problem    How long can you stand comfortably? Pt will put the weight on his left leg    How long can you walk comfortably? 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 No/denies   Pain Onset 1 to 4 weeks ago                   [facility] Treatment/Exercise - 05/03/16 0001    Knee/Hip Exercises: Stretches   LobbyistQuad Stretch Right;3 reps;30 seconds   Knee/Hip Exercises: Aerobic   Nustep hills 3; level 4 x 10:00 n   Knee/Hip Exercises: Machines for Strengthening   Cybex Knee Extension Rt only 4 pl x 05x 2 sets    Cybex Knee Flexion Rt only 7 pl x 10    Knee/Hip Exercises: Standing   Heel Raises Right;2 sets;10 reps   Forward Lunges Both;10 reps   Forward Lunges  Limitations Rt knee onto blue foam    Lateral Step Up Right;10 reps;Step Height: 8"   Forward Step Up Right;10 reps;Step Height: 8"   Step Down Right;10 reps;Step Height: 8"   Wall Squat 2 sets;10 reps   Wall Squat Limitations ball on wall    Rocker Board 2 minutes  R/L and A/P   SLS with Vectors 3 x 15'                   PT Short Term Goals - 05/03/16 1515    PT SHORT TERM GOAL #1   Title Pt to ambulate with an normal heel to toe gait pattern   Time 1   Period Weeks   Status Achieved   PT SHORT TERM GOAL #2   Title Pt strength to increase one grade to be able to ascend and descend steps with ease.    Time 3   Period Weeks   Status Achieved   PT SHORT TERM GOAL #3   Title Pt to be able to single leg stance on Rt LE for 40 seconds to decrease risk of falls    Time 3  Period Weeks   Status Achieved   PT SHORT TERM GOAL #4   Title Pt pain level to be no greater than a 4/10 to allow pt to walk for an hour without discomfort    Time 3   Period Weeks   Status Achieved           PT Long Term Goals - 05/03/16 1517    PT LONG TERM GOAL #1   Title Pt strength to be increased by one grade in his Rt LE to allow patient to squat to the groung and return without difficulty,.   Time 6   Period Weeks   Status On-going   PT LONG TERM GOAL #2   Title PT to be able to single leg stance on Rt LE for 60 seconds to allow pt ambulate on uneven terrain in confidence.    Time 6   Period Weeks   Status On-going   PT LONG TERM GOAL #3   Title Pt pain level to be no greater than a 1/10 in his right knee to allow pt to return to work.    Time 6   Period Weeks   Status Achieved               Plan - 05/03/16 1512    Clinical Impression Statement Increased closed chain exercises with noted fatigue of both quadricep and hamstring musculature with prolong activity.  Added stairs and cybex strengthening as well.    Rehab Potential Good   PT Frequency 3x / week   PT  Duration 6 weeks   PT Treatment/Interventions ADLs/Self Care Home Management;Gait training;Stair training;Functional mobility training;Therapeutic activities;Therapeutic exercise;Balance training;Patient/family education;Manual techniques;Cryotherapy;Electrical Stimulation;Ultrasound;Passive range of motion   PT Next Visit Plan 1 max reps for quadricep and hamstring musculatur next visit to decide if we can progress to plyometric exercises    PT Home Exercise Plan removed seated DF/hamstring curl, added straight leg bridge and encouraged increased weight at home with LAQ   Consulted and Agree with Plan of Care Patient      Patient will benefit from skilled therapeutic intervention in order to improve the following deficits and impairments:  Abnormal gait, Decreased activity tolerance, Decreased balance, Decreased strength, Difficulty walking, Decreased range of motion, Pain, Increased edema  Visit Diagnosis: Muscle weakness (generalized)  History of falling  Stiffness of right knee, not elsewhere classified  Pain in right knee     Problem List Patient Active Problem List   Diagnosis Date Noted  . TEAR LATERAL MENISCUS 01/10/2008  . L C L SPRAIN 01/10/2008  . M C L SPRAIN 01/10/2008  . TEAR A Barrett ShellC L 01/10/2008    [name], PT CLT [phone] 05/03/2016, 3:18 PM  [facility] [address] [place], [place], [address] Phone: [phone]   Fax:  [phone]  Name: Cory Davis MRN: [id number] Date of Birth: December 16, 1990

## 2016-05-03 NOTE — Telephone Encounter (Signed)
Opened in error

## 2016-05-05 ENCOUNTER — Telehealth (HOSPITAL_COMMUNITY): Payer: Self-pay

## 2016-05-05 ENCOUNTER — Encounter (HOSPITAL_COMMUNITY)

## 2016-05-05 NOTE — Telephone Encounter (Signed)
Please cx he has to work today

## 2016-05-07 ENCOUNTER — Ambulatory Visit (HOSPITAL_COMMUNITY)

## 2016-05-07 DIAGNOSIS — M6281 Muscle weakness (generalized): Secondary | ICD-10-CM

## 2016-05-07 DIAGNOSIS — M25661 Stiffness of right knee, not elsewhere classified: Secondary | ICD-10-CM

## 2016-05-07 DIAGNOSIS — M25561 Pain in right knee: Secondary | ICD-10-CM

## 2016-05-07 DIAGNOSIS — Z9181 History of falling: Secondary | ICD-10-CM

## 2016-05-07 NOTE — Therapy (Signed)
[facility] [address] [place], [place], [address] Phone: [phone]   Fax:  [phone]  Physical Therapy Treatment  Patient Details  Name: Cory Davis MRN: [id number] Date of Birth: 08-15-1991 Referring Provider: Viviann SpareSteven Case  Encounter Date: 05/07/2016      PT End of Session - 05/07/16 1204    Visit Number 4   Number of Visits 18   Date for PT Re-Evaluation 05/23/16   Authorization Type [employer] (therapist only)   Authorization - Visit Number 4   Authorization - Number of Visits 18   PT Start Time 1120   PT Stop Time 1200   PT Time Calculation (min) 40 min   Activity Tolerance Patient tolerated treatment well;No increased pain   Behavior During Therapy [facility] for tasks assessed/performed      Past Medical History  Diagnosis Date  . Prediabetes     Past Surgical History  Procedure Laterality Date  . Anterior cruciate ligament repair      There were no vitals filed for this visit.      Subjective Assessment - 05/07/16 1123    Subjective Pt reports everyting is goign well. He has been working on running and stairs.    Pertinent History Rt ACL repain 2009    Currently in Pain? No/denies                         [facility] - 05/07/16 0001    Knee/Hip Exercises: Standing   Heel Raises Right;2 sets;15 reps  starting in DF.    Heel Raises Limitations R single stance   2x10 (started in PF)    Lateral Step Up Right;10 reps;Step Height: 8";2 sets   Forward Step Up Right;Step Height: 8";2 sets   Step Down Right;10 reps;Step Height: 8"  2x10   Other Standing Knee Exercises SLS RDL c CLUE toe touch  8x5sec hold   Knee/Hip Exercises: Supine   Bridges Limitations 1x10 (knee at 90*)  HEP education for hamstrings activation   Single Leg Bridge --  to failure: R 14x, L 18x (weak and limited range)   Other Supine Knee/Hip Exercises Straight leg bridge on ball, feet 6" apart  2x10    Other Supine Knee/Hip Exercises HS curls on ball, butt elevated 1-2"  2x10                PT Education - 05/07/16 1203    Education provided Yes   Education Details explained importance of LT hamstrings developmnet s/p DC for knee integrity.    Person(s) Educated Patient   Methods Explanation;Demonstration   Comprehension Verbalized understanding;Returned demonstration          PT Short Term Goals - 05/03/16 1515    PT SHORT TERM GOAL #1   Title Pt to ambulate with an normal heel to toe gait pattern   Time 1   Period Weeks   Status Achieved   PT SHORT TERM GOAL #2   Title Pt strength to increase one grade to be able to ascend and descend steps with ease.    Time 3   Period Weeks   Status Achieved   PT SHORT TERM GOAL #3   Title Pt to be able to single leg stance on Rt LE for 40 seconds to decrease risk of falls    Time 3   Period Weeks   Status Achieved   PT SHORT TERM GOAL #4  Title Pt pain level to be no greater than a 4/10 to allow pt to walk for an hour without discomfort    Time 3   Period Weeks   Status Achieved           PT Long Term Goals - 05/03/16 1517    PT LONG TERM GOAL #1   Title Pt strength to be increased by one grade in his Rt LE to allow patient to squat to the groung and return without difficulty,.   Time 6   Period Weeks   Status On-going   PT LONG TERM GOAL #2   Title PT to be able to single leg stance on Rt LE for 60 seconds to allow pt ambulate on uneven terrain in confidence.    Time 6   Period Weeks   Status On-going   PT LONG TERM GOAL #3   Title Pt pain level to be no greater than a 1/10 in his right knee to allow pt to return to work.    Time 6   Period Weeks   Status Achieved               Plan - 05/07/16 1204    Clinical Impression Statement Patient tolerating session wel, with min-moderate fatigue noted, but no increase in pain. Making progress toward all goals, able to progress all exercises in  sets/reps. Pt making excellent progress in quads strnegth but remains very limited in hamstrings strength bilat, R side worse.  Progressed to add in more hamstrings activities bilatearlly, but will need to shift to unilateral once appropriate.    Rehab Potential Good   PT Frequency 3x / week   PT Duration 6 weeks   PT Treatment/Interventions ADLs/Self Care Home Management;Gait training;Stair training;Functional mobility training;Therapeutic activities;Therapeutic exercise;Balance training;Patient/family education;Manual techniques;Cryotherapy;Electrical Stimulation;Ultrasound;Passive range of motion   PT Next Visit Plan 1RM assessment for quads; Need more HS development prior to progressing to plyometrics.    PT Home Exercise Plan reviewed bridging, emphasis on full range of hip to zero degrees.   Consulted and Agree with Plan of Care Patient      Patient will benefit from skilled therapeutic intervention in order to improve the following deficits and impairments:  Abnormal gait, Decreased activity tolerance, Decreased balance, Decreased strength, Difficulty walking, Decreased range of motion, Pain, Increased edema  Visit Diagnosis: Muscle weakness (generalized)  History of falling  Stiffness of right knee, not elsewhere classified  Pain in right knee     Problem List Patient Active Problem List   Diagnosis Date Noted  . TEAR LATERAL MENISCUS 01/10/2008  . L C L SPRAIN 01/10/2008  . M C L SPRAIN 01/10/2008  . TEAR A C L 01/10/2008    12:17 PM, 05/07/2016 Rosamaria LintsAllan C Mallary Kreger, PT, DPT Physical Therapist at [facility] [phone] (office)     [facility] [address] [place], [place], [address] Phone: [phone]   Fax:  [phone]  Name: Cory Davis MRN: [id number] Date of Birth: 28-Nov-1990

## 2016-05-13 ENCOUNTER — Telehealth (HOSPITAL_COMMUNITY): Payer: Self-pay

## 2016-05-13 ENCOUNTER — Ambulatory Visit (HOSPITAL_COMMUNITY): Admitting: Physical Therapy

## 2016-05-13 NOTE — Telephone Encounter (Signed)
05/13/16 patient cx - said he just got back from the dr and has the flu

## 2016-05-17 ENCOUNTER — Ambulatory Visit (HOSPITAL_COMMUNITY): Attending: Orthopedic Surgery | Admitting: Physical Therapy

## 2016-05-17 DIAGNOSIS — Z9181 History of falling: Secondary | ICD-10-CM | POA: Diagnosis present

## 2016-05-17 DIAGNOSIS — M6281 Muscle weakness (generalized): Secondary | ICD-10-CM

## 2016-05-17 DIAGNOSIS — M25661 Stiffness of right knee, not elsewhere classified: Secondary | ICD-10-CM

## 2016-05-17 DIAGNOSIS — M25561 Pain in right knee: Secondary | ICD-10-CM

## 2016-05-17 NOTE — Therapy (Signed)
[facility] [address] [place], [place], [address] Phone: [phone]   Fax:  [phone]  Physical Therapy Treatment  Patient Details  Name: Cory Davis MRN: [id number] Date of Birth: 1991-09-13 Referring Provider: Viviann SpareSteven Case  Encounter Date: 05/17/2016      PT End of Session - 05/17/16 1548    Visit Number 5   Number of Visits 18   Date for PT Re-Evaluation 05/23/16   Authorization Type [employer]icare (therapist only)   Authorization - Visit Number 4   Authorization - Number of Visits 18   PT Start Time 1435   PT Stop Time 1513   PT Time Calculation (min) 38 min   Activity Tolerance Patient tolerated treatment well;No increased pain   Behavior During Therapy [facility] for tasks assessed/performed      Past Medical History  Diagnosis Date  . Prediabetes     Past Surgical History  Procedure Laterality Date  . Anterior cruciate ligament repair      There were no vitals filed for this visit.      Subjective Assessment - 05/17/16 1441    Subjective Pt reports things are going good. He feels he is ready to return to activtiy and be finished with PT.   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 No/denies                         [facility] Treatment/Exercise - 05/17/16 0001    Knee/Hip Exercises: Plyometrics   Bilateral Jumping 3 sets;15 reps  2' box for 1st set, jump squat last 2 sets   Bilateral Jumping Limitations (+) knee valgus during takeoff and landing, verbal/visual cues to correct    Knee/Hip Exercises: Standing   Step Down Step Height: 6";Right;Hand Hold: 0;15 reps;1 set  verbal/visual cues to correct hip drop and knee valgus   Other Standing Knee Exercises single leg glute med squat against wall x15 each   verbal and tactile cues to correct knee valgus deviation   Other Standing  Knee Exercises Single leg squat with heel prop, RLE x10 reps   no valgus noted    Knee/Hip Exercises: Supine   Single Leg Bridge Both;20 reps;1 set   Other Supine Knee/Hip Exercises Straight leg bridge on ball, feet 6" apart; supine leg lower against wall x15 each   Other Supine Knee/Hip Exercises HS curl on physioball 2x15                PT Education - 05/17/16 1545    Education provided Yes   Education Details difference between hip strength and stability; importance of hip control to decrease risk of reinjury   Person(s) Educated Patient   Methods Explanation;Demonstration   Comprehension Returned demonstration;Verbalized understanding          PT Short Term Goals - 05/03/16 1515    PT SHORT TERM GOAL #1   Title Pt to ambulate with an normal heel to toe gait pattern   Time 1   Period Weeks   Status Achieved   PT SHORT TERM GOAL #2   Title Pt strength to increase one grade to be able to ascend and descend steps with ease.    Time 3   Period Weeks   Status Achieved   PT SHORT TERM GOAL #3  Title Pt to be able to single leg stance on Rt LE for 40 seconds to decrease risk of falls    Time 3   Period Weeks   Status Achieved   PT SHORT TERM GOAL #4   Title Pt pain level to be no greater than a 4/10 to allow pt to walk for an hour without discomfort    Time 3   Period Weeks   Status Achieved           PT Long Term Goals - 05/03/16 1517    PT LONG TERM GOAL #1   Title Pt strength to be increased by one grade in his Rt LE to allow patient to squat to the groung and return without difficulty,.   Time 6   Period Weeks   Status On-going   PT LONG TERM GOAL #2   Title PT to be able to single leg stance on Rt LE for 60 seconds to allow pt ambulate on uneven terrain in confidence.    Time 6   Period Weeks   Status On-going   PT LONG TERM GOAL #3   Title Pt pain level to be no greater than a 1/10 in his right knee to allow pt to return to work.    Time 6    Period Weeks   Status Achieved               Plan - 05/17/16 1515    Clinical Impression Statement Today's session focused on continued therex and activity to improve hamstring strength. Pt with noted knee valgus and corkscrew deviation with single leg squats secondary to limited hip stability. This was also evident during plyometric activity such as BLE box jumps and squat jumps. Technique does improve somewhat with verbal/tactile cues. Pt with fatigue by the end of today's session, however he had no reports of pain.   Rehab Potential Good   PT Frequency 3x / week   PT Duration 6 weeks   PT Treatment/Interventions ADLs/Self Care Home Management;Gait training;Stair training;Functional mobility training;Therapeutic activities;Therapeutic exercise;Balance training;Patient/family education;Manual techniques;Cryotherapy;Electrical Stimulation;Ultrasound;Passive range of motion   PT Next Visit Plan 1RM assessment for quads; Need more HS development prior to progressing to plyometrics; address hip stability during functional strengthening activity   PT Home Exercise Plan single leg squat with heel prop and encouraged awareness of knee position during activity at the gym   Consulted and Agree with Plan of Care Patient      Patient will benefit from skilled therapeutic intervention in order to improve the following deficits and impairments:  Abnormal gait, Decreased activity tolerance, Decreased balance, Decreased strength, Difficulty walking, Decreased range of motion, Pain, Increased edema  Visit Diagnosis: Muscle weakness (generalized)  History of falling  Stiffness of right knee, not elsewhere classified  Pain in right knee     Problem List Patient Active Problem List   Diagnosis Date Noted  . TEAR LATERAL MENISCUS 01/10/2008  . L C L SPRAIN 01/10/2008  . M C L SPRAIN 01/10/2008  . TEAR A C L 01/10/2008    3:52 PM,05/17/2016 Marylyn Ishihara PT, DPT [facility]  Physical Therapy [phone]  [facility] [address] [place], [place], [address] Phone: [phone]   Fax:  [phone]  Name: LUKIS BUNT MRN: [id number] Date of Birth: 07-22-1991

## 2016-05-19 ENCOUNTER — Ambulatory Visit (HOSPITAL_COMMUNITY)

## 2016-05-21 ENCOUNTER — Ambulatory Visit (HOSPITAL_COMMUNITY)

## 2016-05-21 DIAGNOSIS — M25561 Pain in right knee: Secondary | ICD-10-CM

## 2016-05-21 DIAGNOSIS — M25661 Stiffness of right knee, not elsewhere classified: Secondary | ICD-10-CM

## 2016-05-21 DIAGNOSIS — M6281 Muscle weakness (generalized): Secondary | ICD-10-CM | POA: Diagnosis not present

## 2016-05-21 DIAGNOSIS — Z9181 History of falling: Secondary | ICD-10-CM

## 2016-05-21 NOTE — Therapy (Signed)
[place] [address] [place], [place], [address] Phone: [phone]   Fax:  [phone]  Physical Therapy Treatment (D/C Summary)  Patient Details  Name: Cory Davis MRN: [id number] Date of Birth: 09-10-1991 Referring Provider: Remo Lipps Case  Encounter Date: 05/21/2016      PT End of Session - 05/21/16 1647    Visit Number 6   Number of Visits 18   Date for PT Re-Evaluation 05/23/16   Authorization Type [employer]icare (therapist only)   Authorization - Visit Number 6   Authorization - Number of Visits 18   PT Start Time 1535   PT Stop Time 1611   PT Time Calculation (min) 36 min   Activity Tolerance Patient tolerated treatment well   Behavior During Therapy [facility] for tasks assessed/performed      Past Medical History  Diagnosis Date  . Prediabetes     Past Surgical History  Procedure Laterality Date  . Anterior cruciate ligament repair      There were no vitals filed for this visit.      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  Pt feels like he is ready to d/c PT. Pt started doing weights again.    Pertinent History Rt ACL repain 2009    How long can you sit comfortably? no problem   How long can you stand comfortably? Pt will put the weight on his left leg, 05/21/16 unlilmited- not an issue.    How long can you walk comfortably? 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    Currently in Pain? No/denies            [facility] PT Assessment - 05/21/16 0001    Assessment   Medical Diagnosis Rt menisectomy, removal of bone fragments.    Referring Provider Remo Lipps Case   Onset Date/Surgical Date 04/09/16   Next MD Visit 05/20/2016   Prior Therapy not  for this issue    Precautions   Precautions Knee   Observation/Other Assessments   Focus on Therapeutic Outcomes (FOTO)  99   Functional Tests   Functional tests Single leg stance  60seconds on the right LE   Single Leg Stance   Comments RT 60 seconds.    ROM / Strength   AROM / PROM / Strength Strength   AROM   AROM Assessment Site Knee   Right/Left Knee Right   Right Knee Extension -1   Right Knee Flexion 140   Strength   Right Hip Flexion 5/5   Right Hip Extension 5/5   Right Hip ABduction 5/5   Right Knee Flexion 5/5   Right Knee Extension 5/5  1 rep Max on CAM was 120lbs on R LE    Right Ankle Dorsiflexion 5/5                [facility] PT Assessment - 05/21/16 0001    Assessment   Medical Diagnosis Rt menisectomy, removal of bone fragments.    Referring Provider Remo Lipps Case   Onset Date/Surgical Date 04/09/16   Next MD Visit 05/20/2016   Prior Therapy not for this issue    Precautions   Precautions Knee   Observation/Other Assessments  Focus on Therapeutic Outcomes (FOTO)  99   Functional Tests   Functional tests Single leg stance  60seconds on the right LE   Single Leg Stance   Comments RT 60 seconds.    ROM / Strength   AROM / PROM / Strength Strength   AROM   AROM Assessment Site Knee   Right/Left Knee Right   Right Knee Extension -1   Right Knee Flexion 140   Strength   Right Hip Flexion 5/5   Right Hip Extension 5/5   Right Hip ABduction 5/5   Right Knee Flexion 5/5   Right Knee Extension 5/5  1 rep Max on CAM was 120lbs on R LE    Right Ankle Dorsiflexion 5/5                [facility] Adult PT Treatment/Exercise - 05/21/16 0001    Knee/Hip Exercises: Standing   Other Standing Knee Exercises standing HS curl with 10lbs 2 x 15 R LE, 15 # 2x15 reps R LE   Knee/Hip Exercises: Seated   Hamstring Curl Strengthening;4 sets;15 reps  15 lbs ankle weights.    Hamstring Limitations single leg stool pull with R LE for R HS strength 4 x 39f.                  PT Education - 05/21/16 1647    Education provided Yes   Education Details Continuing HEP   Person(s) Educated Patient   Methods Explanation;Demonstration;Handout   Comprehension Verbalized understanding;Returned demonstration          PT Short Term Goals - 05/21/16 1443    PT SHORT TERM GOAL #1   Title Pt to ambulate with an normal heel to toe gait pattern   Time 1   Period Weeks   Status Achieved   PT SHORT TERM GOAL #2   Title Pt strength to increase one grade to be able to ascend and descend steps with ease.    Time 3   Period Weeks   Status Achieved   PT SHORT TERM GOAL #3   Title Pt to be able to single leg stance on Rt LE for 40 seconds to decrease risk of falls    Time 3   Period Weeks   Status Achieved   PT SHORT TERM GOAL #4   Title Pt pain level to be no greater than a 4/10 to allow pt to walk for an hour without discomfort    Time 3   Period Weeks   Status Achieved           PT Long Term Goals - 05/21/16 1443    PT LONG TERM GOAL #1   Title Pt strength to be increased by one grade in his Rt LE to allow patient to squat to the groung and return without difficulty,.   Time 6   Period Weeks   Status Achieved   PT LONG TERM GOAL #2   Title PT to be able to single leg stance on Rt LE for 60 seconds to allow pt ambulate on uneven terrain in confidence.    Baseline 05/21/2016 - held SLS on R LE for 60 seconds.    Time 6   Period Weeks   Status Achieved   PT LONG TERM GOAL #3   Title Pt pain level to be no greater than a 1/10 in his right knee to allow pt to return to work.    Time 6   Status Achieved  Plan - 05/21/16 1648    Clinical Impression Statement Pt demonstrates great improvement with LE strength, balance, and endurance.  He was able to perform 120lbs for 1 rep max on the R LE.  Pt has met all PT goals at this time, and agrees that he feels ready to be d/c from PT at this time.  Encouraged pt to continue  with emphasis on hamstring strengthening, as well as continue with HEP.      Rehab Potential Good   PT Home Exercise Plan Hamstring curls, and hamstring pull with rolling chair.    Consulted and Agree with Plan of Care Patient     PHYSICAL THERAPY DISCHARGE SUMMARY  Visits from Start of Care: 6 out of 18  Current functional level related to goals / functional outcomes: Pt has demonstrated excellent gains with strength, AROM and balance.  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  He has not reported any pain for several treatments now.     Remaining deficits: Pt continues to demonstrate muscle imbalance with regards to strength of his quadricep muscles over powering the strength of his hamstring muscles.  Educated pt on several exercises and HEP to progress hamstring strength.    Education / Equipment: HEP handout given.  Plan: Patient agrees to discharge.  Patient goals were met. Patient is being discharged due to meeting the stated rehab goals.  ?????       Patient will benefit from skilled therapeutic intervention in order to improve the following deficits and impairments:  Abnormal gait, Decreased activity tolerance, Decreased balance, Decreased strength, Difficulty walking, Decreased range of motion, Pain, Increased edema  Visit Diagnosis: Muscle weakness (generalized)  History of falling  Stiffness of right knee, not elsewhere classified  Pain in right knee     Problem List Patient Active Problem List   Diagnosis Date Noted  . TEAR LATERAL MENISCUS 01/10/2008  . L C L SPRAIN 01/10/2008  . M C L SPRAIN 01/10/2008  . TEAR A C L 01/10/2008    Genni Buske 05/21/2016, 4:53 PM  [place] [place] [address] [place], [place], [address] Phone: [phone]   Fax:  [phone]  Name: Cory Davis MRN: [id number] Date of Birth: 08/04/1991

## 2016-05-21 NOTE — Patient Instructions (Signed)
  STANDING HAMSTRING CURLS  While standing, bend your knee so that your heel moves towards your buttock. Lower back down until first contact with floor and repeat.  Keep knees in-line with one another.  Add 15-20 pound ankle weights. 3 x 15 reps, 2x's per day   Pull yourself across the floor with Right leg only - sitting in a rolling chair.

## 2016-05-24 ENCOUNTER — Encounter (HOSPITAL_COMMUNITY): Admitting: Physical Therapy

## 2016-05-26 ENCOUNTER — Encounter (HOSPITAL_COMMUNITY): Admitting: Physical Therapy

## 2016-05-28 ENCOUNTER — Encounter (HOSPITAL_COMMUNITY)

## 2016-05-31 ENCOUNTER — Encounter (HOSPITAL_COMMUNITY): Admitting: Physical Therapy

## 2016-06-02 ENCOUNTER — Encounter (HOSPITAL_COMMUNITY): Admitting: Physical Therapy

## 2016-06-04 ENCOUNTER — Encounter (HOSPITAL_COMMUNITY)

## 2016-06-09 ENCOUNTER — Emergency Department (HOSPITAL_COMMUNITY)
Admission: EM | Admit: 2016-06-09 | Discharge: 2016-06-10 | Disposition: A | Discharge status: Home / Self Care | Attending: Emergency Medicine | Admitting: Emergency Medicine

## 2016-06-09 ENCOUNTER — Encounter (HOSPITAL_COMMUNITY): Payer: Self-pay | Admitting: Emergency Medicine

## 2016-06-09 ENCOUNTER — Emergency Department (HOSPITAL_COMMUNITY)

## 2016-06-09 DIAGNOSIS — F17210 Nicotine dependence, cigarettes, uncomplicated: Secondary | ICD-10-CM | POA: Insufficient documentation

## 2016-06-09 DIAGNOSIS — R109 Unspecified abdominal pain: Secondary | ICD-10-CM | POA: Diagnosis not present

## 2016-06-09 DIAGNOSIS — R319 Hematuria, unspecified: Secondary | ICD-10-CM | POA: Diagnosis not present

## 2016-06-09 DIAGNOSIS — Z79899 Other long term (current) drug therapy: Secondary | ICD-10-CM | POA: Diagnosis not present

## 2016-06-09 DIAGNOSIS — R52 Pain, unspecified: Secondary | ICD-10-CM

## 2016-06-09 HISTORY — DX: Disorder of kidney and ureter, unspecified: N28.9

## 2016-06-09 HISTORY — DX: Sciatica, unspecified side: M54.30

## 2016-06-09 HISTORY — DX: Dorsalgia, unspecified: M54.9

## 2016-06-09 HISTORY — DX: Pain in unspecified knee: M25.569

## 2016-06-09 HISTORY — DX: Other chronic pain: G89.29

## 2016-06-09 LAB — COMPREHENSIVE METABOLIC PANEL
ALBUMIN: 4.6 g/dL (ref 3.5–5.0)
ALT: 34 U/L (ref 17–63)
ANION GAP: 6 (ref 5–15)
AST: 20 U/L (ref 15–41)
Alkaline Phosphatase: 55 U/L (ref 38–126)
BILIRUBIN TOTAL: 0.5 mg/dL (ref 0.3–1.2)
BUN: 13 mg/dL (ref 6–20)
CALCIUM: 9.6 mg/dL (ref 8.9–10.3)
CO2: 28 mmol/L (ref 22–32)
Chloride: 106 mmol/L (ref 101–111)
Creatinine, Ser: 0.93 mg/dL (ref 0.61–1.24)
GFR calc Af Amer: >60 mL/min (ref >60)
GLUCOSE: 93 mg/dL (ref 65–99)
POTASSIUM: 3.6 mmol/L (ref 3.5–5.1)
Sodium: 140 mmol/L (ref 135–145)
TOTAL PROTEIN: 7.6 g/dL (ref 6.5–8.1)

## 2016-06-09 LAB — CBC WITH DIFFERENTIAL/PLATELET
BASOS ABS: 0 10*3/uL (ref 0.0–0.1)
Basophils Relative: 0 %
EOS PCT: 4 %
Eosinophils Absolute: 0.4 10*3/uL (ref 0.0–0.7)
HCT: 43.3 % (ref 39.0–52.0)
Hemoglobin: 14.8 g/dL (ref 13.0–17.0)
LYMPHS PCT: 47 %
Lymphs Abs: 5.1 10*3/uL — ABNORMAL HIGH (ref 0.7–4.0)
MCH: 28.6 pg (ref 26.0–34.0)
MCHC: 34.2 g/dL (ref 30.0–36.0)
MCV: 83.6 fL (ref 78.0–100.0)
Monocytes Absolute: 0.6 10*3/uL (ref 0.1–1.0)
Monocytes Relative: 6 %
NEUTROS ABS: 4.6 10*3/uL (ref 1.7–7.7)
Neutrophils Relative %: 43 %
PLATELETS: 236 10*3/uL (ref 150–400)
RBC: 5.18 MIL/uL (ref 4.22–5.81)
RDW: 14.3 % (ref 11.5–15.5)
WBC: 10.7 10*3/uL — AB (ref 4.0–10.5)

## 2016-06-09 LAB — URINALYSIS, ROUTINE W REFLEX MICROSCOPIC
Bilirubin Urine: NEGATIVE
GLUCOSE, UA: NEGATIVE mg/dL
KETONES UR: NEGATIVE mg/dL
LEUKOCYTES UA: NEGATIVE
Nitrite: NEGATIVE
PROTEIN: NEGATIVE mg/dL
Specific Gravity, Urine: 1.02 (ref 1.005–1.030)
pH: 6.5 (ref 5.0–8.0)

## 2016-06-09 LAB — URINE MICROSCOPIC-ADD ON

## 2016-06-09 MED ORDER — OXYCODONE-ACETAMINOPHEN 5-325 MG PO TABS: 1.0000 | ORAL_TABLET | Freq: Four times a day (QID) | ORAL | 0 refills | Status: DC | PRN

## 2016-06-09 MED ORDER — CEPHALEXIN 500 MG PO CAPS: 500.0000 mg | ORAL_CAPSULE | Freq: Four times a day (QID) | ORAL | 0 refills | Status: DC

## 2016-06-09 MED ORDER — CEPHALEXIN 500 MG PO CAPS
500.0000 mg | ORAL_CAPSULE | Freq: Once | ORAL | Status: AC
  Administered 2016-06-09: 500 mg via ORAL
  Filled 2016-06-09: qty 1

## 2016-06-09 MED ORDER — ONDANSETRON HCL 4 MG/2ML IJ SOLN
4.0000 mg | Freq: Once | INTRAMUSCULAR | Status: AC
  Administered 2016-06-09: 4 mg via INTRAVENOUS
  Filled 2016-06-09: qty 2

## 2016-06-09 MED ORDER — OXYCODONE-ACETAMINOPHEN 5-325 MG PO TABS
1.0000 | ORAL_TABLET | Freq: Once | ORAL | Status: AC
  Administered 2016-06-09: 1 via ORAL
  Filled 2016-06-09: qty 1

## 2016-06-09 MED ORDER — KETOROLAC TROMETHAMINE 30 MG/ML IJ SOLN
30.0000 mg | Freq: Once | INTRAMUSCULAR | Status: AC
  Administered 2016-06-09: 30 mg via INTRAVENOUS
  Filled 2016-06-09: qty 1

## 2016-06-09 NOTE — Discharge Instructions (Signed)
Follow-up with [facility] next week. Return sooner problem

## 2016-06-09 NOTE — ED Provider Notes (Signed)
[facility] DEPT Provider Note   CSN: [id number] Arrival date & time: 06/09/16  2105   First MD Initiated Contact with Patient 06/09/16 2128   By signing my name below, I, Levon Hedger, attest that this documentation has been prepared under the direction and in the presence of Bethann Berkshire, MD . Electronically Signed: Levon Hedger, Scribe. 06/09/2016. 10:24 PM.    History   Chief Complaint Chief Complaint  Patient presents with  . Flank Pain    HPI Cory Davis is a 25 y.o. male with PMHx of renal disorder who presents to the Emergency Department complaining of sudden onset, moderate right flank pain which began last night. No alleviating or modifying factors noted. Pt notes associated hematuria. Pt states he had kidney stones about one month ago which he passed at home. Per pt, the pain today is much worse. He also states he had a recent physical where there was "lots of blood" in his urine. Pt denies fever, vomiting, or abdominal pain.   Patient with a history of kidney stones. He states he passed 2 stones today. Patient complains of pain right flank   The history is provided by the patient. No language interpreter was used.  Flank Pain  This is a new problem. The current episode started yesterday. The problem occurs constantly. The problem has not changed since onset.Pertinent negatives include no chest pain, no abdominal pain and no headaches. Nothing aggravates the symptoms. Nothing relieves the symptoms. He has tried nothing for the symptoms. The treatment provided no relief.    Past Medical History:  Diagnosis Date  . Chronic back pain   . Chronic knee pain   . Prediabetes   . Renal disorder   . Sciatica     Patient Active Problem List   Diagnosis Date Noted  . TEAR LATERAL MENISCUS 01/10/2008  . L C L SPRAIN 01/10/2008  . M C L SPRAIN 01/10/2008  . TEAR A C L 01/10/2008    Past Surgical History:  Procedure Laterality Date  . ANTERIOR CRUCIATE LIGAMENT  REPAIR        Home Medications    Prior to Admission medications   Medication Sig Start Date End Date Taking? Authorizing Provider  diclofenac (VOLTAREN) 50 MG EC tablet Take 1 tablet (50 mg total) by mouth 2 (two) times daily. 03/07/16   Hope Orlene Och, NP  gabapentin (NEURONTIN) 300 MG capsule Take 300 mg by mouth 3 (three) times daily. 01/24/16   Historical Provider, MD  HYDROcodone-acetaminophen (NORCO/VICODIN) 5-325 MG tablet Take 1 tablet by mouth every 4 (four) hours as needed. 03/07/16   Hope Orlene Och, NP  traMADol (ULTRAM) 50 MG tablet Take 50 mg by mouth 3 (three) times daily as needed. 02/25/16   Historical Provider, MD    Family History Family History  Problem Relation Age of Onset  . Diabetes Other     Social History Social History  Substance Use Topics  . Smoking status: Current Every Day Smoker    Packs/day: 0.50    Years: 10.00    Types: Cigarettes  . Smokeless tobacco: Never Used  . Alcohol use No     Allergies   Review of patient's allergies indicates no known allergies.   Review of Systems Review of Systems  Constitutional: Negative for appetite change, chills, fatigue and fever.  HENT: Negative for congestion, ear discharge and sinus pressure.   Eyes: Negative for discharge.  Respiratory: Negative for cough.   Cardiovascular: Negative for chest pain.  Gastrointestinal: Negative for abdominal pain, diarrhea and vomiting.  Genitourinary: Positive for flank pain and hematuria. Negative for frequency.  Musculoskeletal: Negative for back pain.  Skin: Negative for rash.  Neurological: Negative for seizures and headaches.  Psychiatric/Behavioral: Negative for hallucinations.    Physical Exam Updated Vital Signs BP 136/74 (BP Location: Left Arm)   Pulse 82   Temp 98.8 F (37.1 C) (Oral)   Resp 20   Ht 6\' 2"  (1.88 m)   Wt 225 lb (102.1 kg)   SpO2 100%   BMI 28.89 kg/m   Physical Exam  Constitutional: He is oriented to person, place, and time. He  appears well-developed.  HENT:  Head: Normocephalic.  Eyes: Conjunctivae and EOM are normal. No scleral icterus.  Neck: Neck supple. No thyromegaly present.  Cardiovascular: Normal rate and regular rhythm.  Exam reveals no gallop and no friction rub.   No murmur heard. Pulmonary/Chest: No stridor. He has no wheezes. He has no rales. He exhibits no tenderness.  Abdominal: He exhibits no distension. There is no tenderness. There is no rebound.  Genitourinary:  Genitourinary Comments: Moderate right flank tenderness   Musculoskeletal: Normal range of motion. He exhibits no edema.  Lymphadenopathy:    He has no cervical adenopathy.  Neurological: He is oriented to person, place, and time. He exhibits normal muscle tone. Coordination normal.  Skin: No rash noted. No erythema.  Psychiatric: He has a normal mood and affect. His behavior is normal.     ED Treatments / Results  DIAGNOSTIC STUDIES:  Oxygen Saturation is 100% on RA, normal by my interpretation.    COORDINATION OF CARE:  9:34 PM Discussed treatment plan with pt at bedside and pt agreed to plan.  Labs (all labs ordered are listed, but only abnormal results are displayed) Labs Reviewed - No data to display  EKG  EKG Interpretation None       Radiology No results found.  Procedures Procedures (including critical care time)  Medications Ordered in ED Medications - No data to display   Initial Impression / Assessment and Plan / ED Course  I have reviewed the triage vital signs and the nursing notes.  Pertinent labs & imaging results that were available during my care of the patient were reviewed by me and considered in my medical decision making (see chart for details).  Clinical Course    Patient with persistent right flank pain and history of kidney stones. No ureteral stone seen on CT.Marland Kitchen Patient is sent home with pain medicine nausea medicine antibiotics and will follow-up in urology  Final Clinical  Impressions(s) / ED Diagnoses   Final diagnoses:  None   The chart was scribed for me under my direct supervision.  I personally performed the history, physical, and medical decision making and all procedures in the evaluation of this patient..   New Prescriptions New Prescriptions   No medications on file     Bethann Berkshire, MD 06/10/16 1540

## 2016-06-09 NOTE — ED Triage Notes (Signed)
Pt c/o rt flank pain since last night with blood in urine. Pt states he has passed multiple kidney stones today but pain is worse.

## 2016-06-09 NOTE — ED Notes (Signed)
Pt to xray

## 2016-06-09 NOTE — ED Notes (Signed)
Pt back from X-ray.  

## 2016-06-11 LAB — URINE CULTURE: Culture: NO GROWTH

## 2016-06-13 ENCOUNTER — Encounter (HOSPITAL_COMMUNITY): Payer: Self-pay

## 2016-06-13 ENCOUNTER — Emergency Department (HOSPITAL_COMMUNITY)
Admission: EM | Admit: 2016-06-13 | Discharge: 2016-06-13 | Disposition: A | Discharge status: Home / Self Care | Attending: Emergency Medicine | Admitting: Emergency Medicine

## 2016-06-13 DIAGNOSIS — R109 Unspecified abdominal pain: Secondary | ICD-10-CM | POA: Diagnosis present

## 2016-06-13 DIAGNOSIS — F17210 Nicotine dependence, cigarettes, uncomplicated: Secondary | ICD-10-CM | POA: Diagnosis not present

## 2016-06-13 DIAGNOSIS — Z79899 Other long term (current) drug therapy: Secondary | ICD-10-CM | POA: Insufficient documentation

## 2016-06-13 DIAGNOSIS — N23 Unspecified renal colic: Secondary | ICD-10-CM | POA: Insufficient documentation

## 2016-06-13 LAB — URINALYSIS, ROUTINE W REFLEX MICROSCOPIC
Bilirubin Urine: NEGATIVE
Glucose, UA: NEGATIVE mg/dL
Hgb urine dipstick: NEGATIVE
KETONES UR: NEGATIVE mg/dL
NITRITE: NEGATIVE
PH: 7 (ref 5.0–8.0)
Protein, ur: NEGATIVE mg/dL
SPECIFIC GRAVITY, URINE: 1.01 (ref 1.005–1.030)

## 2016-06-13 LAB — CBC WITH DIFFERENTIAL/PLATELET
BASOS PCT: 0 %
Basophils Absolute: 0 10*3/uL (ref 0.0–0.1)
EOS ABS: 0.4 10*3/uL (ref 0.0–0.7)
EOS PCT: 3 %
HCT: 45.6 % (ref 39.0–52.0)
HEMOGLOBIN: 16 g/dL (ref 13.0–17.0)
LYMPHS ABS: 4.3 10*3/uL — AB (ref 0.7–4.0)
Lymphocytes Relative: 31 %
MCH: 29.5 pg (ref 26.0–34.0)
MCHC: 35.1 g/dL (ref 30.0–36.0)
MCV: 84 fL (ref 78.0–100.0)
MONO ABS: 0.8 10*3/uL (ref 0.1–1.0)
MONOS PCT: 6 %
NEUTROS PCT: 60 %
Neutro Abs: 8.2 10*3/uL — ABNORMAL HIGH (ref 1.7–7.7)
PLATELETS: 226 10*3/uL (ref 150–400)
RBC: 5.43 MIL/uL (ref 4.22–5.81)
RDW: 14.4 % (ref 11.5–15.5)
WBC: 13.7 10*3/uL — ABNORMAL HIGH (ref 4.0–10.5)

## 2016-06-13 LAB — BASIC METABOLIC PANEL
Anion gap: 6 (ref 5–15)
BUN: 15 mg/dL (ref 6–20)
CALCIUM: 9.3 mg/dL (ref 8.9–10.3)
CHLORIDE: 101 mmol/L (ref 101–111)
CO2: 25 mmol/L (ref 22–32)
CREATININE: 0.94 mg/dL (ref 0.61–1.24)
GFR calc non Af Amer: >60 mL/min (ref >60)
Glucose, Bld: 97 mg/dL (ref 65–99)
Potassium: 3.8 mmol/L (ref 3.5–5.1)
Sodium: 132 mmol/L — ABNORMAL LOW (ref 135–145)

## 2016-06-13 LAB — URINE MICROSCOPIC-ADD ON
Bacteria, UA: NONE SEEN
SQUAMOUS EPITHELIAL / LPF: NONE SEEN

## 2016-06-13 MED ORDER — HYDROMORPHONE HCL 1 MG/ML IJ SOLN
1.0000 mg | Freq: Once | INTRAMUSCULAR | Status: AC
  Administered 2016-06-13: 1 mg via INTRAVENOUS
  Filled 2016-06-13: qty 1

## 2016-06-13 MED ORDER — HYDROCODONE-ACETAMINOPHEN 5-325 MG PO TABS: 1.0000 | ORAL_TABLET | ORAL | 0 refills | Status: DC | PRN

## 2016-06-13 MED ORDER — TAMSULOSIN HCL 0.4 MG PO CAPS: 0.4000 mg | ORAL_CAPSULE | Freq: Every day | ORAL | 0 refills | Status: DC

## 2016-06-13 MED ORDER — TAMSULOSIN HCL 0.4 MG PO CAPS
0.4000 mg | ORAL_CAPSULE | Freq: Once | ORAL | Status: AC
  Administered 2016-06-13: 0.4 mg via ORAL
  Filled 2016-06-13: qty 1

## 2016-06-13 MED ORDER — ONDANSETRON HCL 4 MG/2ML IJ SOLN
4.0000 mg | Freq: Once | INTRAMUSCULAR | Status: AC
  Administered 2016-06-13: 4 mg via INTRAVENOUS
  Filled 2016-06-13: qty 2

## 2016-06-13 MED ORDER — KETOROLAC TROMETHAMINE 30 MG/ML IJ SOLN
30.0000 mg | Freq: Once | INTRAMUSCULAR | Status: AC
  Administered 2016-06-13: 30 mg via INTRAVENOUS
  Filled 2016-06-13: qty 1

## 2016-06-13 NOTE — ED Notes (Signed)
Patient states he has been vomiting unable to keep anything down today due to pain.

## 2016-06-13 NOTE — ED Provider Notes (Signed)
[facility] Provider Note   CSN: [id number] Arrival date & time: 06/13/16  1904  First Provider Contact:   First MD Initiated Contact with Patient 06/13/16 2133      By signing my name below, I, Emmanuella Mensah, attest that this documentation has been prepared under the direction and in the presence of Burgess AmorJulie Merari Pion, PA-C. Electronically Signed: Angelene GiovanniEmmanuella Mensah, ED Scribe. 06/13/16. 10:04 PM.    History   Chief Complaint Chief Complaint  Patient presents with  . Flank Pain    HPI Comments: Cory Davis is a 25 y.o. male with a hx of kidney stones and chronic back pain who presents to the Emergency Department complaining of gradually worsening non-radiating 6/10 right flank pain onset 4 days ago. He reports associated nausea and 2 episodes of non-bloody vomiting today.  No alleviating factors noted. Pt was seen on 06/10/16 for these symptoms where he was told that he had kidney stones within his kidneys and discharged with Keflex and oxycodone which has not relieved his pain. He reports a hx of recurrent kidney stones, stating that he normally passes them on his own. He adds that the last time he was seen for kidney stones, he received Flomax. He denies any fever, chills, abdominal pain, diarrhea, hematuria, or dysuria. Pt [place].    The history is provided by the patient. No language interpreter was used.    Past Medical History:  Diagnosis Date  . Chronic back pain   . Chronic knee pain   . Prediabetes   . Renal disorder   . Sciatica     Patient Active Problem List   Diagnosis Date Noted  . TEAR LATERAL MENISCUS 01/10/2008  . L C L SPRAIN 01/10/2008  . M C L SPRAIN 01/10/2008  . TEAR A C L 01/10/2008    Past Surgical History:  Procedure Laterality Date  . ANTERIOR CRUCIATE LIGAMENT REPAIR         Home Medications    Prior to Admission medications   Medication Sig Start Date End Date Taking? Authorizing  Provider  cephALEXin (KEFLEX) 500 MG capsule Take 1 capsule (500 mg total) by mouth 4 (four) times daily. 06/09/16  Yes Bethann BerkshireJoseph Zammit, MD  HYDROcodone-acetaminophen (NORCO/VICODIN) 5-325 MG tablet Take 1 tablet by mouth every 4 (four) hours as needed. 06/13/16   Burgess AmorJulie Cass Vandermeulen, PA-C  oxyCODONE-acetaminophen (PERCOCET/ROXICET) 5-325 MG tablet Take 1 tablet by mouth every 6 (six) hours as needed. Patient not taking: Reported on 06/13/2016 06/09/16   Bethann BerkshireJoseph Zammit, MD  tamsulosin (FLOMAX) 0.4 MG CAPS capsule Take 1 capsule (0.4 mg total) by mouth daily after supper. 06/13/16   Burgess AmorJulie Dysen Edmondson, PA-C    Family History Family History  Problem Relation Age of Onset  . Diabetes Other     Social History Social History  Substance Use Topics  . Smoking status: Current Every Day Smoker    Packs/day: 0.50    Years: 10.00    Types: Cigarettes  . Smokeless tobacco: Never Used  . Alcohol use No     Allergies   Review of patient's allergies indicates no known allergies.   Review of Systems Review of Systems  Constitutional: Negative for chills and fever.  Gastrointestinal: Positive for nausea and vomiting. Negative for abdominal pain and diarrhea.  Genitourinary: Positive for flank pain. Negative for dysuria and hematuria.  All other systems reviewed and are negative.    Physical Exam Updated Vital Signs BP 126/80   Pulse 77  Temp 98 F (36.7 C) (Oral)   Resp 16   Ht 6\' 2"  (1.88 m)   Wt 102.1 kg   SpO2 98%   BMI 28.89 kg/m   Physical Exam  Constitutional: He is oriented to person, place, and time. He appears well-developed and well-nourished.  HENT:  Head: Normocephalic and atraumatic.  Cardiovascular: Normal rate.   Pulmonary/Chest: Effort normal.  Abdominal: Soft. He exhibits no distension and no mass. There is no tenderness. There is no guarding.  Tender over right CVA  Neurological: He is alert and oriented to person, place, and time.  Skin: Skin is warm and dry.  Psychiatric: He has a  normal mood and affect.  Nursing note and vitals reviewed.    ED Treatments / Results  DIAGNOSTIC STUDIES: Oxygen Saturation is 100% on RA, normal by my interpretation.    COORDINATION OF CARE: 9:46 PM- Pt advised of plan for treatment and pt agrees. Pt will receive lab work for further evaluation. He will receive Flomax, Toradol, Dilaudid, and Zofran.    Labs (all labs ordered are listed, but only abnormal results are displayed) Labs Reviewed  URINALYSIS, ROUTINE W REFLEX MICROSCOPIC (NOT AT [facility]) - Abnormal; Notable for the following:       Result Value   Leukocytes, UA TRACE (*)    All other components within normal limits  BASIC METABOLIC PANEL - Abnormal; Notable for the following:    Sodium 132 (*)    All other components within normal limits  CBC WITH DIFFERENTIAL/PLATELET - Abnormal; Notable for the following:    WBC 13.7 (*)    Neutro Abs 8.2 (*)    Lymphs Abs 4.3 (*)    All other components within normal limits  URINE MICROSCOPIC-ADD ON    EKG  EKG Interpretation None       Radiology No results found.  Procedures Procedures (including critical care time)  Medications Ordered in ED Medications  ketorolac (TORADOL) 30 MG/ML injection 30 mg (30 mg Intravenous Given 06/13/16 2146)  HYDROmorphone (DILAUDID) injection 1 mg (1 mg Intravenous Given 06/13/16 2146)  ondansetron (ZOFRAN) injection 4 mg (4 mg Intravenous Given 06/13/16 2146)  tamsulosin (FLOMAX) capsule 0.4 mg (0.4 mg Oral Given 06/13/16 2209)     Initial Impression / Assessment and Plan / ED Course  Burgess Amor, PA-C has reviewed the triage vital signs and the nursing notes.  CT scan and labs from prior visit reviewed and discussed with patient.  Pertinent labs & imaging results that were available during my care of the patient were reviewed by me and considered in my medical decision making (see chart for details).  Clinical Course    Pt advised to continue his keflex until completed.  Although CT  did not show a ureteral stone, will add flomax which may help with colic. Pt states hydrocodone has been a better pain med for him then oxycodone.  He was prescribed a small quantity.  Plan to f/u with [facility] this week.    Final Clinical Impressions(s) / ED Diagnoses   Final diagnoses:  Ureteral colic   I personally performed the services described in this documentation, which was scribed in my presence. The recorded information has been reviewed and is accurate.  New Prescriptions Discharge Medication List as of 06/13/2016 11:17 PM    START taking these medications   Details  HYDROcodone-acetaminophen (NORCO/VICODIN) 5-325 MG tablet Take 1 tablet by mouth every 4 (four) hours as needed., Starting Sun 06/13/2016, Print    tamsulosin (  FLOMAX) 0.4 MG CAPS capsule Take 1 capsule (0.4 mg total) by mouth daily after supper., Starting Sun 06/13/2016, Print         Burgess Amor, PA-C 06/14/16 1436    Loren Racer, MD 06/15/16 2246

## 2016-06-13 NOTE — Discharge Instructions (Signed)
It is possible you are trying to pass a kidney stone that was too small to find on the CT scan you had on the 2nd, or you are having renal colic as outlined above.  You may take the hydrocodone in place of the oxycodone.  Take your next dose of flomax tomorrow evening.  Make sure you are drinking plenty of fluids.  Finish the antibiotics you were prescribed at your last visit.

## 2016-06-13 NOTE — ED Triage Notes (Signed)
Diagnosed with kidney stone this past week. Pain continues to right flank. Vomiting X2 per patient.

## 2016-06-18 ENCOUNTER — Emergency Department (HOSPITAL_COMMUNITY)
Admission: EM | Admit: 2016-06-18 | Discharge: 2016-06-19 | Disposition: A | Discharge status: Home / Self Care | Attending: Emergency Medicine | Admitting: Emergency Medicine

## 2016-06-18 ENCOUNTER — Encounter (HOSPITAL_COMMUNITY): Payer: Self-pay

## 2016-06-18 DIAGNOSIS — R110 Nausea: Secondary | ICD-10-CM | POA: Insufficient documentation

## 2016-06-18 DIAGNOSIS — Z79899 Other long term (current) drug therapy: Secondary | ICD-10-CM | POA: Diagnosis not present

## 2016-06-18 DIAGNOSIS — R300 Dysuria: Secondary | ICD-10-CM | POA: Diagnosis not present

## 2016-06-18 DIAGNOSIS — F17210 Nicotine dependence, cigarettes, uncomplicated: Secondary | ICD-10-CM | POA: Diagnosis not present

## 2016-06-18 DIAGNOSIS — R109 Unspecified abdominal pain: Secondary | ICD-10-CM | POA: Insufficient documentation

## 2016-06-18 MED ORDER — KETOROLAC TROMETHAMINE 30 MG/ML IJ SOLN
30.0000 mg | Freq: Once | INTRAMUSCULAR | Status: AC
  Administered 2016-06-19: 30 mg via INTRAVENOUS
  Filled 2016-06-18: qty 1

## 2016-06-18 MED ORDER — ONDANSETRON HCL 4 MG/2ML IJ SOLN
4.0000 mg | Freq: Once | INTRAMUSCULAR | Status: AC
  Administered 2016-06-19: 4 mg via INTRAVENOUS
  Filled 2016-06-18: qty 2

## 2016-06-18 MED ORDER — FENTANYL CITRATE (PF) 100 MCG/2ML IJ SOLN
50.0000 ug | Freq: Once | INTRAMUSCULAR | Status: AC
  Administered 2016-06-19: 50 ug via INTRAVENOUS
  Filled 2016-06-18: qty 2

## 2016-06-18 MED ORDER — SODIUM CHLORIDE 0.9 % IV SOLN
INTRAVENOUS | Status: DC
  Administered 2016-06-19: via INTRAVENOUS

## 2016-06-18 NOTE — ED Triage Notes (Signed)
Patient complaining of pain in right flank.  Was diagnosed with kidney stones a couple of weeks ago and have been passing kidney stones but still having pain with fever.  Was told to come in for reassessment.

## 2016-06-18 NOTE — ED Provider Notes (Signed)
[facility]Y DEPT Provider Note   CSN: [id number] Arrival date & time: 06/18/16  2122  First Provider Contact:   First MD Initiated Contact with Patient 06/18/16 1127      By signing my name below, I, Emmanuella Mensah, attest that this documentation has been prepared under the direction and in the presence of Devoria AlbeIva Daveah Varone, MD. Electronically Signed: Angelene GiovanniEmmanuella Mensah, ED Scribe. 06/18/16. 11:44 PM.    History   Chief Complaint Chief Complaint  Patient presents with  . Flank Pain    HPI Comments: Stark Braynthony J Wilcock is a 25 y.o. male with a hx of renal disorder, recurrent kidney stones, and chronic back pain who presents to the Emergency Department complaining of intermittent sharp, shooting right flank pain that lasts for a few seconds onset several weeks ago. He reports that he is here today because his pain is more constant yesterday and today. He reports associated nausea, pain in his right flank made worse by urinating, and subjective fever with chills onset this afternoon. He adds that his chills lasted for approx 5-10 minutes. He denies that anything specific makes the pain worse or better. No alleviating factors noted. Pt has not tried any medications PTA. He explains that he passed a small kidney stone on his own tonight but did not have any relief of his pain after that. Pt was seen on 06/10/16 and discharged with Keflex which he states that he was complaint with for one week. He denies a family hx of kidney stones. He denies any vomiting, diarrhea, dysuria, hematuria, or cough. He states that he has tried to schedule an appointment with a Urologist but will not be seen until September 2017, because he is in school all day during the weekday. He states [place] in [place] whose told him he had calcium stones..   No current PCP states he was seeing Dr. Delbert Harnesson [place] but now he goes to urgent care.  The history is provided by the patient. No language interpreter was used.    Past  Medical History:  Diagnosis Date  . Chronic back pain   . Chronic knee pain   . Prediabetes   . Renal disorder   . Sciatica     Patient Active Problem List   Diagnosis Date Noted  . TEAR LATERAL MENISCUS 01/10/2008  . L C L SPRAIN 01/10/2008  . M C L SPRAIN 01/10/2008  . TEAR A C L 01/10/2008    Past Surgical History:  Procedure Laterality Date  . ANTERIOR CRUCIATE LIGAMENT REPAIR        Home Medications    Prior to Admission medications   Medication Sig Start Date End Date Taking? Authorizing Provider  cephALEXin (KEFLEX) 500 MG capsule Take 1 capsule (500 mg total) by mouth 4 (four) times daily. 06/09/16   Bethann BerkshireJoseph Zammit, MD  cyclobenzaprine (FLEXERIL) 10 MG tablet Take 1 tablet (10 mg total) by mouth 3 (three) times daily as needed for muscle spasms. 06/19/16   Devoria AlbeIva Isamar Nazir, MD  HYDROcodone-acetaminophen (NORCO/VICODIN) 5-325 MG tablet Take 1 tablet by mouth every 4 (four) hours as needed. 06/13/16   Burgess AmorJulie Idol, PA-C  naproxen (NAPROSYN) 500 MG tablet Take 1 po BID with food prn pain 06/19/16   Devoria AlbeIva Jaimy Kliethermes, MD  oxyCODONE-acetaminophen (PERCOCET/ROXICET) 5-325 MG tablet Take 1 tablet by mouth every 6 (six) hours as needed. Patient not taking: Reported on 06/13/2016 06/09/16   Bethann BerkshireJoseph Zammit, MD  tamsulosin (FLOMAX) 0.4 MG CAPS capsule Take 1 capsule (0.4 mg total) by  mouth daily after supper. 06/13/16   Burgess Amor, PA-C    Family History Family History  Problem Relation Age of Onset  . Diabetes Other     Social History Social History  Substance Use Topics  . Smoking status: Current Every Day Smoker    Packs/day: 0.50    Years: 10.00    Types: Cigarettes  . Smokeless tobacco: Never Used  . Alcohol use No   In school  Allergies   Review of patient's allergies indicates no known allergies.   Review of Systems Review of Systems  Constitutional: Positive for chills and fever.  Respiratory: Negative for cough.   Gastrointestinal: Positive for nausea. Negative for diarrhea and  vomiting.  Genitourinary: Positive for dysuria and flank pain.  All other systems reviewed and are negative.    Physical Exam Updated Vital Signs BP 131/72 (BP Location: Left Arm)   Pulse 72   Temp 99.3 F (37.4 C) (Oral)   Resp 16   Ht 6\' 2"  (1.88 m)   Wt 220 lb (99.8 kg)   SpO2 97%   BMI 28.25 kg/m   Vital signs normal except low grade temp   Physical Exam  Constitutional: He is oriented to person, place, and time. He appears well-developed and well-nourished.  Non-toxic appearance. He does not appear ill. No distress.  HENT:  Head: Normocephalic and atraumatic.  Right Ear: External ear normal.  Left Ear: External ear normal.  Nose: Nose normal. No mucosal edema or rhinorrhea.  Mouth/Throat: Oropharynx is clear and moist and mucous membranes are normal. No dental abscesses or uvula swelling.  Eyes: Conjunctivae and EOM are normal. Pupils are equal, round, and reactive to light.  Neck: Normal range of motion and full passive range of motion without pain. Neck supple.  Cardiovascular: Normal rate, regular rhythm and normal heart sounds.  Exam reveals no gallop and no friction rub.   No murmur heard. Pulmonary/Chest: Effort normal and breath sounds normal. No respiratory distress. He has no wheezes. He has no rhonchi. He has no rales. He exhibits no tenderness and no crepitus.  Abdominal: Soft. Normal appearance and bowel sounds are normal. He exhibits no distension. There is no tenderness. There is no rebound and no guarding.  Indicates pain in right lateral abdomen and right flank but pt is non tender   Musculoskeletal: Normal range of motion. He exhibits no edema or tenderness.  Moves all extremities well.   Neurological: He is alert and oriented to person, place, and time. He has normal strength. No cranial nerve deficit.  Skin: Skin is warm, dry and intact. No rash noted. No erythema. No pallor.  Psychiatric: He has a normal mood and affect. His speech is normal and  behavior is normal. His mood appears not anxious.  Nursing note and vitals reviewed.    ED Treatments / Results  DIAGNOSTIC STUDIES: Oxygen Saturation is 100% on RA, normal by my interpretation.     Labs (all labs ordered are listed, but only abnormal results are displayed) Results for orders placed or performed during the hospital encounter of 06/18/16  Culture, blood (routine x 2)  Result Value Ref Range   Specimen Description BLOOD RIGHT FOREARM    Special Requests BOTTLES DRAWN AEROBIC AND ANAEROBIC [facility] EACH    Culture PENDING    Report Status PENDING   Culture, blood (routine x 2)  Result Value Ref Range   Specimen Description LEFT ANTECUBITAL    Special Requests BOTTLES DRAWN AEROBIC AND ANAEROBIC [facility] EACH  Culture PENDING    Report Status PENDING   Urinalysis, Routine w reflex microscopic  Result Value Ref Range   Color, Urine YELLOW YELLOW   APPearance CLEAR CLEAR   Specific Gravity, Urine 1.010 1.005 - 1.030   pH 6.0 5.0 - 8.0   Glucose, UA NEGATIVE NEGATIVE mg/dL   Hgb urine dipstick NEGATIVE NEGATIVE   Bilirubin Urine NEGATIVE NEGATIVE   Ketones, ur NEGATIVE NEGATIVE mg/dL   Protein, ur NEGATIVE NEGATIVE mg/dL   Nitrite NEGATIVE NEGATIVE   Leukocytes, UA NEGATIVE NEGATIVE  CBC with Differential  Result Value Ref Range   WBC 11.2 (H) 4.0 - 10.5 K/uL   RBC 5.04 4.22 - 5.81 MIL/uL   Hemoglobin 14.5 13.0 - 17.0 g/dL   HCT 47.8 29.5 - 62.1 %   MCV 83.9 78.0 - 100.0 fL   MCH 28.8 26.0 - 34.0 pg   MCHC 34.3 30.0 - 36.0 g/dL   RDW 30.8 65.7 - 84.6 %   Platelets 222 150 - 400 K/uL   Neutrophils Relative % 45 %   Neutro Abs 5.0 1.7 - 7.7 K/uL   Lymphocytes Relative 44 %   Lymphs Abs 5.0 (H) 0.7 - 4.0 K/uL   Monocytes Relative 7 %   Monocytes Absolute 0.8 0.1 - 1.0 K/uL   Eosinophils Relative 3 %   Eosinophils Absolute 0.4 0.0 - 0.7 K/uL   Basophils Relative 1 %   Basophils Absolute 0.1 0.0 - 0.1 K/uL  Basic metabolic panel  Result Value Ref Range    Sodium 137 135 - 145 mmol/L   Potassium 3.9 3.5 - 5.1 mmol/L   Chloride 104 101 - 111 mmol/L   CO2 25 22 - 32 mmol/L   Glucose, Bld 92 65 - 99 mg/dL   BUN 13 6 - 20 mg/dL   Creatinine, Ser 9.62 0.61 - 1.24 mg/dL   Calcium 9.6 8.9 - 95.2 mg/dL   GFR calc non Af Amer >60 >60 mL/min   GFR calc Af Amer >60 >60 mL/min   Anion gap 8 5 - 15     Laboratory interpretation all normal     Radiology No results found.    June 09, 2016 CLINICAL DATA:  25 year old male with right side flank pain and gross hematuria since last night. Initial encounter.  EXAM: CT ABDOMEN AND PELVIS WITHOUT CONTRAST   FINDINGS:  Several nonobstructing stones in the left kidney, ranging from 2 mm to 5 mm. Negative left ureter.  Unremarkable urinary bladder.  Several right renal stones are present measuring up to 4 mm. No right hydronephrosis or perinephric stranding. No right hydroureter. No calculus along the course of the right ureter.  IMPRESSION: 1. Bilateral nephrolithiasis with no ureteral calculus or obstructive uropathy. 2. Otherwise negative noncontrast CT Abdomen and Pelvis.   Electronically Signed   By: Odessa Fleming M.D.   On: 06/09/2016 22:36     Procedures Procedures (including critical care time)  Medications Ordered in ED Medications  0.9 %  sodium chloride infusion ( Intravenous New Bag/Given 06/19/16 0021)  fentaNYL (SUBLIMAZE) injection 50 mcg (50 mcg Intravenous Given 06/19/16 0021)  ketorolac (TORADOL) 30 MG/ML injection 30 mg (30 mg Intravenous Given 06/19/16 0021)  ondansetron (ZOFRAN) injection 4 mg (4 mg Intravenous Given 06/19/16 0021)     Initial Impression / Assessment and Plan / ED Course  Devoria Albe, MD has reviewed the triage vital signs and the nursing notes.  Pertinent labs & imaging results that were available during my care of  the patient were reviewed by me and considered in my medical decision making (see chart for details).  Clinical Course      11:44 PM- Pt advised of plan for treatment and pt agrees. Pt informed of his CT renal stone study indicating that he has several kidney stones in bilateral kidneys. He will receive IV fluids, Fentanyl, Toradol, and Zofran. He will also receive lab work for further evaluation.   2:30 AM we went over his test results. His urine tonight does not show any evidence of infection. When I review his urine culture from August 2 it was negative growth. He states his pain is better. He appears to be more comfortable. This is his third ED visit in August for the same pain. It is interesting that his doctor stopped prescribing narcotics to him and since then he has been coming to the ED for flank pain. He was sent home with a non-arctic prescription of naproxen and Flexeril.  Review of the [place] database shows patient was getting #90 oxycodone from Dr. Delbert Harness on May 3, May 30, and June 29. Before that he got #60 tramadol on February 22nd, and March 22, and #90 tramadol on April 19. He received hydrocodone 5/325 from our ER on April 30 #20 tablets, and #20 hydrocodone 5/325 from our ED on August 6. Patient did report he no longer sees Dr. Delbert Harness.  Final Clinical Impressions(s) / ED Diagnoses   Final diagnoses:  Right flank pain     New Prescriptions New Prescriptions   CYCLOBENZAPRINE (FLEXERIL) 10 MG TABLET    Take 1 tablet (10 mg total) by mouth 3 (three) times daily as needed for muscle spasms.   NAPROXEN (NAPROSYN) 500 MG TABLET    Take 1 po BID with food prn pain   Plan discharge  Devoria Albe, MD, FACEP   I personally performed the services described in this documentation, which was scribed in my presence. The recorded information has been reviewed and considered.  Devoria Albe, MD, Concha Pyo, MD 06/19/16 [phone]

## 2016-06-19 LAB — CBC WITH DIFFERENTIAL/PLATELET
BASOS PCT: 1 %
Basophils Absolute: 0.1 10*3/uL (ref 0.0–0.1)
Eosinophils Absolute: 0.4 10*3/uL (ref 0.0–0.7)
Eosinophils Relative: 3 %
HEMATOCRIT: 42.3 % (ref 39.0–52.0)
Hemoglobin: 14.5 g/dL (ref 13.0–17.0)
Lymphocytes Relative: 44 %
Lymphs Abs: 5 10*3/uL — ABNORMAL HIGH (ref 0.7–4.0)
MCH: 28.8 pg (ref 26.0–34.0)
MCHC: 34.3 g/dL (ref 30.0–36.0)
MCV: 83.9 fL (ref 78.0–100.0)
MONO ABS: 0.8 10*3/uL (ref 0.1–1.0)
MONOS PCT: 7 %
NEUTROS ABS: 5 10*3/uL (ref 1.7–7.7)
Neutrophils Relative %: 45 %
Platelets: 222 10*3/uL (ref 150–400)
RBC: 5.04 MIL/uL (ref 4.22–5.81)
RDW: 14.1 % (ref 11.5–15.5)
WBC: 11.2 10*3/uL — ABNORMAL HIGH (ref 4.0–10.5)

## 2016-06-19 LAB — BASIC METABOLIC PANEL
Anion gap: 8 (ref 5–15)
BUN: 13 mg/dL (ref 6–20)
CALCIUM: 9.6 mg/dL (ref 8.9–10.3)
CO2: 25 mmol/L (ref 22–32)
CREATININE: 0.94 mg/dL (ref 0.61–1.24)
Chloride: 104 mmol/L (ref 101–111)
GFR calc non Af Amer: >60 mL/min (ref >60)
Glucose, Bld: 92 mg/dL (ref 65–99)
Potassium: 3.9 mmol/L (ref 3.5–5.1)
SODIUM: 137 mmol/L (ref 135–145)

## 2016-06-19 LAB — URINALYSIS, ROUTINE W REFLEX MICROSCOPIC
Bilirubin Urine: NEGATIVE
GLUCOSE, UA: NEGATIVE mg/dL
Hgb urine dipstick: NEGATIVE
KETONES UR: NEGATIVE mg/dL
LEUKOCYTES UA: NEGATIVE
Nitrite: NEGATIVE
PH: 6 (ref 5.0–8.0)
Protein, ur: NEGATIVE mg/dL
Specific Gravity, Urine: 1.01 (ref 1.005–1.030)

## 2016-06-19 MED ORDER — NAPROXEN 500 MG PO TABS: ORAL_TABLET | ORAL | 0 refills | Status: DC

## 2016-06-19 MED ORDER — CYCLOBENZAPRINE HCL 10 MG PO TABS: 10.0000 mg | ORAL_TABLET | Freq: Three times a day (TID) | ORAL | 0 refills | Status: DC | PRN

## 2016-06-19 NOTE — Discharge Instructions (Signed)
Drink plenty of fluids, especially if you are working outside and sweating a lot. Sports drinks are good to prevent dehydration. Take the medications as prescribed. Your urine culture from August 2nd was negative, so I did not restart you on antibiotics tonight. You need to follow up with your doctor at Urgent Care if you are unable to see a urologist.  Return to the ED if you get fever, have uncontrolled vomiting or your pain gets severe.

## 2016-06-20 LAB — URINE CULTURE: Culture: NO GROWTH

## 2016-06-24 LAB — CULTURE, BLOOD (ROUTINE X 2)
CULTURE: NO GROWTH
Culture: NO GROWTH

## 2016-12-20 ENCOUNTER — Encounter (HOSPITAL_COMMUNITY): Payer: Self-pay | Admitting: Emergency Medicine

## 2016-12-20 ENCOUNTER — Emergency Department (HOSPITAL_COMMUNITY)
Admission: EM | Admit: 2016-12-20 | Discharge: 2016-12-20 | Disposition: A | Discharge status: Home / Self Care | Attending: Emergency Medicine | Admitting: Emergency Medicine

## 2016-12-20 DIAGNOSIS — B029 Zoster without complications: Secondary | ICD-10-CM | POA: Diagnosis not present

## 2016-12-20 DIAGNOSIS — R21 Rash and other nonspecific skin eruption: Secondary | ICD-10-CM | POA: Diagnosis present

## 2016-12-20 DIAGNOSIS — F17210 Nicotine dependence, cigarettes, uncomplicated: Secondary | ICD-10-CM | POA: Diagnosis not present

## 2016-12-20 MED ORDER — HYDROCODONE-ACETAMINOPHEN 5-325 MG PO TABS: 1.0000 | ORAL_TABLET | Freq: Three times a day (TID) | ORAL | 0 refills | Status: AC | PRN

## 2016-12-20 MED ORDER — ACETAMINOPHEN 500 MG PO TABS
1000.0000 mg | ORAL_TABLET | Freq: Three times a day (TID) | ORAL | 0 refills | Status: AC
Start: 2016-12-20 — End: 2016-12-25

## 2016-12-20 MED ORDER — ACYCLOVIR 400 MG PO TABS
800.0000 mg | ORAL_TABLET | Freq: Every day | ORAL | 0 refills | Status: AC
Start: 2016-12-20 — End: 2016-12-27

## 2016-12-20 NOTE — ED Triage Notes (Signed)
Pt started having rash on neck that now wraps around to right side of chest that is burning.  Pt states "I think I have the shingles"

## 2016-12-20 NOTE — ED Provider Notes (Signed)
[facility] DEPT Provider Note   CSN: [id number] Arrival date & time: 12/20/16  1907     History   Chief Complaint Chief Complaint  Patient presents with  . Rash    HPI Cory Davis is a 26 y.o. male.  The history is provided by the patient.  Rash   This is a new problem. Episode onset: 1 week. The problem has been gradually worsening. The problem is associated with nothing. There has been no fever. Affected Location: started on right back last week and progressed to right chest 2 days ago. The pain is severe. The pain has been constant since onset. Associated symptoms include pain (burning ). He has tried OTC analgesics for the symptoms. The treatment provided no relief. Risk factors: no new meds, exposures.    Past Medical History:  Diagnosis Date  . Chronic back pain   . Chronic knee pain   . Prediabetes   . Renal disorder   . Sciatica     Patient Active Problem List   Diagnosis Date Noted  . TEAR LATERAL MENISCUS 01/10/2008  . L C L SPRAIN 01/10/2008  . M C L SPRAIN 01/10/2008  . TEAR A C L 01/10/2008    Past Surgical History:  Procedure Laterality Date  . ANTERIOR CRUCIATE LIGAMENT REPAIR    . KNEE ARTHROPLASTY         Home Medications    Prior to Admission medications   Medication Sig Start Date End Date Taking? Authorizing Provider  acetaminophen (TYLENOL) 500 MG tablet Take 2 tablets (1,000 mg total) by mouth every 8 (eight) hours. Do not take more than 4000 mg of acetaminophen (Tylenol) in a 24-hour period. Please note that other medicines that you may be prescribed may have Tylenol as well. 12/20/16 12/25/16  Nira Conn, MD  acyclovir (ZOVIRAX) 400 MG tablet Take 2 tablets (800 mg total) by mouth 5 (five) times daily. 12/20/16 12/27/16  Nira Conn, MD  cephALEXin (KEFLEX) 500 MG capsule Take 1 capsule (500 mg total) by mouth 4 (four) times daily. 06/09/16   Bethann Berkshire, MD  cyclobenzaprine (FLEXERIL) 10 MG tablet Take 1 tablet  (10 mg total) by mouth 3 (three) times daily as needed for muscle spasms. 06/19/16   Devoria Albe, MD  HYDROcodone-acetaminophen (NORCO/VICODIN) 5-325 MG tablet Take 1 tablet by mouth every 8 (eight) hours as needed for severe pain (That is not improved by your scheduled acetaminophen regimen). Please do not exceed 4000 mg of acetaminophen (Tylenol) a 24-hour period. Please note that he may be prescribed additional medicine that contains acetaminophen. 12/20/16 12/25/16  Nira Conn, MD  naproxen (NAPROSYN) 500 MG tablet Take 1 po BID with food prn pain 06/19/16   Devoria Albe, MD  oxyCODONE-acetaminophen (PERCOCET/ROXICET) 5-325 MG tablet Take 1 tablet by mouth every 6 (six) hours as needed. Patient not taking: Reported on 06/13/2016 06/09/16   Bethann Berkshire, MD  tamsulosin (FLOMAX) 0.4 MG CAPS capsule Take 1 capsule (0.4 mg total) by mouth daily after supper. 06/13/16   Burgess Amor, PA-C    Family History Family History  Problem Relation Age of Onset  . Diabetes Other     Social History Social History  Substance Use Topics  . Smoking status: Current Every Day Smoker    Packs/day: 0.50    Years: 10.00    Types: Cigarettes  . Smokeless tobacco: Former Neurosurgeon  . Alcohol use Yes     Comment: occ     Allergies  Patient has no known allergies.   Review of Systems Review of Systems  Skin: Positive for rash.   Ten systems are reviewed and are negative for acute change except as noted in the HPI   Physical Exam Updated Vital Signs BP 116/71 (BP Location: Left Arm)   Pulse 101   Temp 98.1 F (36.7 C) (Oral)   Resp 18   Ht 6\' 2"  (1.88 m)   Wt 250 lb (113.4 kg)   SpO2 97%   BMI 32.10 kg/m   Physical Exam  Constitutional: He is oriented to person, place, and time. He appears well-developed and well-nourished. No distress.  HENT:  Head: Normocephalic and atraumatic.  Right Ear: External ear normal.  Left Ear: External ear normal.  Nose: Nose normal.  Mouth/Throat: Mucous  membranes are normal. No trismus in the jaw.  Eyes: Conjunctivae and EOM are normal. No scleral icterus.  Neck: Normal range of motion and phonation normal.  Cardiovascular: Normal rate and regular rhythm.   Pulmonary/Chest: Effort normal. No stridor. No respiratory distress.  Abdominal: He exhibits no distension.  Musculoskeletal: Normal range of motion. He exhibits no edema.  Neurological: He is alert and oriented to person, place, and time.  Skin: Rash (along C5 dermatome on the right) noted. Rash is macular and vesicular (crusted). He is not diaphoretic.  Psychiatric: He has a normal mood and affect. His behavior is normal.  Vitals reviewed.    ED Treatments / Results  Labs (all labs ordered are listed, but only abnormal results are displayed) Labs Reviewed - No data to display  EKG  EKG Interpretation None       Radiology No results found.  Procedures Procedures (including critical care time)  Medications Ordered in ED Medications - No data to display   Initial Impression / Assessment and Plan / ED Course  I have reviewed the triage vital signs and the nursing notes.  Pertinent labs & imaging results that were available during my care of the patient were reviewed by me and considered in my medical decision making (see chart for details).     Shingles vs staph infection. Given dermatomal distribution, most consistent with shingles. Will provide with Acyclovir Rx and have pt follow up with PCP.  [place] narcotic data base w/o recent filled Rxs. Will provide pt with 5 days of norco for pain control.  The patient is safe for discharge with strict return precautions.   Final Clinical Impressions(s) / ED Diagnoses   Final diagnoses:  Herpes zoster without complication   Disposition: Discharge  Condition: Good  I have discussed the results, Dx and Tx plan with the patient who expressed understanding and agree(s) with the plan. Discharge instructions discussed at  great length. The patient was given strict return precautions who verbalized understanding of the instructions. No further questions at time of discharge.    New Prescriptions   ACETAMINOPHEN (TYLENOL) 500 MG TABLET    Take 2 tablets (1,000 mg total) by mouth every 8 (eight) hours. Do not take more than 4000 mg of acetaminophen (Tylenol) in a 24-hour period. Please note that other medicines that you may be prescribed may have Tylenol as well.   ACYCLOVIR (ZOVIRAX) 400 MG TABLET    Take 2 tablets (800 mg total) by mouth 5 (five) times daily.   HYDROCODONE-ACETAMINOPHEN (NORCO/VICODIN) 5-325 MG TABLET    Take 1 tablet by mouth every 8 (eight) hours as needed for severe pain (That is not improved by your scheduled acetaminophen regimen). Please  do not exceed 4000 mg of acetaminophen (Tylenol) a 24-hour period. Please note that he may be prescribed additional medicine that contains acetaminophen.    Follow Up: primary care provider         Nira Conn, MD 12/20/16 2007

## 2017-08-01 ENCOUNTER — Ambulatory Visit (INDEPENDENT_AMBULATORY_CARE_PROVIDER_SITE_OTHER): Payer: PRIVATE HEALTH INSURANCE | Admitting: Family Medicine

## 2017-08-01 ENCOUNTER — Encounter: Payer: Self-pay | Admitting: Family Medicine

## 2017-08-01 VITALS — BP 134/86 | Temp 98.3°F | Ht 74.0 in | Wt 250.0 lb

## 2017-08-01 DIAGNOSIS — J310 Chronic rhinitis: Secondary | ICD-10-CM

## 2017-08-01 DIAGNOSIS — J329 Chronic sinusitis, unspecified: Secondary | ICD-10-CM

## 2017-08-01 MED ORDER — HYDROCODONE-HOMATROPINE 5-1.5 MG/5ML PO SYRP: ORAL_SOLUTION | ORAL | 0 refills | Status: DC

## 2017-08-01 MED ORDER — CEFDINIR 300 MG PO CAPS: 300.0000 mg | ORAL_CAPSULE | Freq: Two times a day (BID) | ORAL | 0 refills | Status: DC

## 2017-08-01 NOTE — Progress Notes (Signed)
   Subjective:    Patient ID: Cory Davis, male    DOB: April 21, 1991, 26 y.o.   MRN: [id number]  Sinusitis  This is a new problem. Episode onset: 2 days. Associated symptoms include congestion, coughing, headaches and a sore throat. (Wheezing) Past treatments include acetaminophen (cough and cold meds).   Pos exposure to cong and dranage and coguh   Frontal sinus,  Some fever early on, now gone   Feeling wheezy tight in chest    Using robitussin prn , tussionex prn , bad cough leading to vomiting at times  Hoping to quit smoking son     Review of Systems  HENT: Positive for congestion and sore throat.   Respiratory: Positive for cough.   Neurological: Positive for headaches.       Objective:   Physical Exam  Alert, mild malaise. Hydration good Vitals stable. frontal/ maxillary tenderness evident positive nasal congestion. pharynx normal neck supple  lungs clear/no crackles or wheezes. heart regular in rhythm       Assessment & Plan:  Impression rhinosinusitis likely post viral, discussed with patient. plan antibiotics prescribed. Q /Bronchitis uestions answered. Severe cough at nighttime Symptomatic care discussed. warning signs discussed. WSL

## 2017-08-08 ENCOUNTER — Other Ambulatory Visit: Payer: Self-pay | Admitting: *Deleted

## 2017-08-08 ENCOUNTER — Telehealth: Payer: Self-pay | Admitting: Family Medicine

## 2017-08-08 MED ORDER — HYDROCODONE-HOMATROPINE 5-1.5 MG/5ML PO SYRP: ORAL_SOLUTION | ORAL | 0 refills | Status: DC

## 2017-08-08 MED ORDER — CEFDINIR 300 MG PO CAPS: 300.0000 mg | ORAL_CAPSULE | Freq: Two times a day (BID) | ORAL | 0 refills | Status: DC

## 2017-08-08 NOTE — Telephone Encounter (Signed)
Patient called to check on message.

## 2017-08-08 NOTE — Telephone Encounter (Signed)
may refill both

## 2017-08-08 NOTE — Telephone Encounter (Signed)
Pt notified refill sent to pharm on antibiotic and he needs to pickup hycodan at the office.

## 2017-08-08 NOTE — Telephone Encounter (Signed)
Has not checked temp but doesn't feel like he has a fever, no trouble breathing. Some better while on antibiotic and hycodan but felt like it was not enough.

## 2017-08-08 NOTE — Telephone Encounter (Signed)
Patient seen Dr. Brett Canales on 08/01/17 for rhinosinusitis.  He was prescribed cefdinir and hycodan.  He is out of both medications.  He said he is still having cough, runny nose, pretty much the same symptoms.  He said the first few days he was feeling better, but now feeling back to the way he was.  He wants to know if we can refill antibiotic and hycodan.   [facility]

## 2017-08-09 ENCOUNTER — Other Ambulatory Visit: Payer: Self-pay | Admitting: *Deleted

## 2017-08-09 ENCOUNTER — Telehealth: Payer: Self-pay | Admitting: Family Medicine

## 2017-08-09 MED ORDER — HYDROCODONE-HOMATROPINE 5-1.5 MG/5ML PO SYRP: ORAL_SOLUTION | ORAL | 0 refills | Status: DC

## 2017-08-09 NOTE — Telephone Encounter (Signed)
Patient picked up Rx for hycodan yesterday.  The pharmacy is unable to fill this with the way it is currently wrote.  He said instead of writing to take at bed time, write to take as needed.  [facility]

## 2017-08-09 NOTE — Telephone Encounter (Signed)
Discussed with pt. Script ready for pickup. Called [facility] and canceled rx for hycodan that was wrote yesterday.

## 2017-08-09 NOTE — Telephone Encounter (Signed)
Fill hycodan on 9/24. Pharm states it was 18 day supply. Insurance will not pay for scipt that was wrote yesterday. Pt states he only took at night when he worked but there was some days he was off and he took twice a day. Consult with dr Brett Canales. Ok to write rx for hycodan for one tsp up to tid #90 drowsy precautions and let pt know no more refills on this med per dr Brett Canales.

## 2017-08-12 ENCOUNTER — Telehealth: Payer: Self-pay | Admitting: Family Medicine

## 2017-08-12 MED ORDER — FEXOFENADINE HCL 180 MG PO TABS: 180.0000 mg | ORAL_TABLET | Freq: Every day | ORAL | 0 refills | Status: DC

## 2017-08-12 MED ORDER — FLUTICASONE PROPIONATE 50 MCG/ACT NA SUSP: 2.0000 | Freq: Every day | NASAL | 0 refills | Status: DC

## 2017-08-12 MED ORDER — AMOXICILLIN-POT CLAVULANATE 875-125 MG PO TABS: 1.0000 | ORAL_TABLET | Freq: Two times a day (BID) | ORAL | 0 refills | Status: AC

## 2017-08-12 NOTE — Telephone Encounter (Signed)
Patient seen Dr. Brett Canales on 08/01/17 for rhinosinusitis.  He said he is not feeling any better.  He is still having real bad cough, trouble sleeping, sneezing, coughing up mucous.  He wants to know what the doctor recommends?

## 2017-08-12 NOTE — Telephone Encounter (Signed)
Real bad cough, trouble sleeping, sneezing, coughing up mucous

## 2017-08-12 NOTE — Telephone Encounter (Signed)
Prescriptions sent electronically to pharmacy. Patient notified. °

## 2017-08-12 NOTE — Telephone Encounter (Signed)
I would recommend stopping current antibiotic. Instead Augmentin 875 mg 1 twice a day with a snack for 10 days, may use over-the-counter allergy medicine generic Allegra 180 mg 1 daily, generic Flonase 2 sprays each nostril daily, should gradually get better over the course of the next 10 days follow-up if progressive troubles or worse

## 2017-09-23 ENCOUNTER — Encounter: Payer: Self-pay | Admitting: Family Medicine

## 2017-09-23 ENCOUNTER — Ambulatory Visit: Payer: PRIVATE HEALTH INSURANCE | Admitting: Family Medicine

## 2017-09-23 VITALS — BP 126/94 | Ht 74.0 in | Wt 255.0 lb

## 2017-09-23 DIAGNOSIS — Z23 Encounter for immunization: Secondary | ICD-10-CM | POA: Diagnosis not present

## 2017-09-23 DIAGNOSIS — R454 Irritability and anger: Secondary | ICD-10-CM | POA: Diagnosis not present

## 2017-09-23 DIAGNOSIS — F4310 Post-traumatic stress disorder, unspecified: Secondary | ICD-10-CM

## 2017-09-23 DIAGNOSIS — F411 Generalized anxiety disorder: Secondary | ICD-10-CM

## 2017-09-23 MED ORDER — BUPROPION HCL ER (SR) 150 MG PO TB12: ORAL_TABLET | ORAL | 6 refills | Status: DC

## 2017-09-23 NOTE — Progress Notes (Signed)
   Subjective:    Patient ID: Cory Davis, male    DOB: [birth year], 26 y.o.   MRN: [id number]  HPI Patient is here today to follow up on anger issues. He states he feels irritated and has outburst (Yelling/cursing) a lot in general, not at a situation or a person.Gad/PHQ 9 filled out.   Anger challenges  Has lways had a temper  Now its getting worse   Police officer in [place]   Patient has history of [employer] service.  Developed anxiety then.  Also developed worsening anger issues.  Also diagnosed with PTSD.  Stated at one point he was on as many as 36 pills/day which was excessively sedating.    No suicidal thoughts.  No homicidal thoughts.   ptsd diagnosis, nostly anxiety and not depression, din ot experience   Went through a yr of therapy and meds, overall may have helped some, bur not the group sessions   Girlfriend in 6 months also states patient has major challenge with anger and she is concerned about   Sore iWould like flu shot/ Given.   Review of Systems No headache, no major weight loss or weight gain, no chest pain no back pain abdominal pain no change in bowel habits complete ROS otherwise negative     Objective:   Physical Exam  Alert and oriented, vitals reviewed and stable, NAD ENT-TM's and ext canals WNL bilat via otoscopic exam Soft palate, tonsils and post pharynx WNL via oropharyngeal exam Neck-symmetric, no masses; thyroid nonpalpable and nontender Pulmonary-no tachypnea or accessory muscle use; Clear without wheezes via auscultation Card--no abnrml murmurs, rhythm reg and rate WNL Carotid pulses symmetric, without bruits      Assessment & Plan:  Impression 1 generalized anxiety disorder with PTSD with substantial anger control issues.  Long discussion.  Will initiate Wellbutrin.  Pros and cons discussed.  Also psychology referral.  Follow-up in 1 month.  Greater than 50% of this 25 minute face to face visit was spent in counseling  and discussion and coordination of care regarding the above diagnosis/diagnosies

## 2017-09-23 NOTE — Patient Instructions (Signed)
Anxiety  Tips for Managing Your Anger HOW CAN ANGER AFFECT MY LIFE? Everyone feels angry from time to time. It is okay and normal to feel angry. However, the way that you behave or react to anger can make it a problem. If you react too strongly to anger or you cannot control your anger, that can cause relationships problems at home and work. Anger can also affect your health. Uncontrolled anger increases your risk of heart disease. When you are angry, your heart rate and blood pressure rise. Levels of certain energy hormones, such as adrenaline, also increase. When this happens, your heart has to work harder. In extreme cases, anger can cause the blood vessels to become narrow. This reduces the supply of blood and oxygen to the heart, and that can trigger chest pain (angina). Anger can also trigger stress-related problems, such as:  Headaches.  Poor digestion.  Trouble sleeping (insomnia).  WHAT ACTIONS CAN I TAKE TO HELP MANAGE MY ANGER? You can take actions to help you manage your anger. For example:  Express your anger. When you express your anger in a healthy way, it is a form of communication. The following strategies can help you to express your anger in a healthy way and when you are ready to do so: ? Step away. When you are feeling reactive, it may take at least 20 minutes for your body to return to its normal blood pressure and heart rate. To help your body do this, take a walk, listen to music, stretch, take deep breaths, and avoid the situation or person who is making you angry. Try to only discuss your anger when you feel calm again. ? Try to consider how others feel before you react. Avoid swearing, sighing, raising your voice, or blaming. ? Choose a good time to work through problems. You may be more likely to lose your temper at the end of the day when you are tired. ? Keep an anger journal. Writing down the situations that make you angry can help you figure out what triggers your  anger and why.  Consider changing your perception. Is there another way you can view the situation that will leave you with a different emotion? Sometimes, changing the way you think about a situation can make it seem less infuriating. Here are some ways to do that: ? Remind yourself that everyone is not out to get you. ? Remind yourself that a disappointing result is not the end of the world. ? Take steps to solve or prevent the situation that upsets you. ? Find the humor in an aggravating situation. ? Deal with the physical effects by taking deep breaths, exercising, or taking a walk. ? Slowly repeat the word "relax" or another calming phrase. ? Picture a relaxing image in your mind. Close your eyes and use that image to help you calm yourself.  WHEN SHOULD I SEEK ADDITIONAL HELP? Anger becomes a problem if it occurs frequently and lasts for long periods of time. You may also need help managing your anger if:  You use physical force or aggression when you are angry and others feel threatened and fearful.  You feel that your anger is out of control.  Anger is interfering with your job.  Anger is causing problems with your health.  Anger is causing problems with your relationships.  Anger is affecting your ability to tolerate normal daily situations, such as sitting in a traffic jam or waiting in line.  You treat others disrespectfully.  You do not trust people around you.  It may help to ask someone you trust whether he or she thinks you show any of these signs. Sometimes, it can be hard to recognize the problem yourself. WHERE CAN I GET SUPPORT? A psychologist or another licensed mental health professional can help you learn how to manage your anger. Ask your health care provider for a referral, or look online to find a psychologist who specializes in anger management. You can search the websites of many mental health organizations to find a mental health care provider. Local  Domestic Abuse Projects are also available for help. Your local hospital or behavioral counselors in your area may also offer anger management programs or support groups that can help. WHERE CAN I FIND MORE INFORMATION?  The U.S. Centers for Disease Control and Prevention: VisualTrips.huwww.cdc.gov/bam/life/getting-along3.html  American Psychological Association: https://www.jennings-kim.com/www.apa.org/topics/anger/  The Substance Abuse and Mental Health Services Administration: EpicRoom.plhttp://www.samhsa.gov/samhsaNewsLetter/Volume_22_Number_3/working_with_anger/  [employer] [facility] on Domestic Violence: ChurchReunion.co.ukhttp://www.nrcdv.org/dvam/  This information is not intended to replace advice given to you by your health care provider. Make sure you discuss any questions you have with your health care provider. Document Released: 08/22/2007 Document Revised: 06/27/2016 Document Reviewed: 08/29/2015 Elsevier Interactive Patient Education  2018 Elsevier Inc.  Generalized Anxiety Disorder, Adult Generalized anxiety disorder (GAD) is a mental health disorder. People with this condition constantly worry about everyday events. Unlike normal anxiety, worry related to GAD is not triggered by a specific event. These worries also do not fade or get better with time. GAD interferes with life functions, including relationships, work, and school. GAD can vary from mild to severe. People with severe GAD can have intense waves of anxiety with physical symptoms (panic attacks). What are the causes? The exact cause of GAD is not known. What increases the risk? This condition is more likely to develop in:  Women.  People who have a family history of anxiety disorders.  People who are very shy.  People who experience very stressful life events, such as the death of a loved one.  People who have a very stressful family environment.  What are the signs or symptoms? People with GAD often worry excessively about many things in their lives, such as their  health and family. They may also be overly concerned about:  Doing well at work.  Being on time.  Natural disasters.  Friendships.  Physical symptoms of GAD include:  Fatigue.  Muscle tension or having muscle twitches.  Trembling or feeling shaky.  Being easily startled.  Feeling like your heart is pounding or racing.  Feeling out of breath or like you cannot take a deep breath.  Having trouble falling asleep or staying asleep.  Sweating.  Nausea, diarrhea, or irritable bowel syndrome (IBS).  Headaches.  Trouble concentrating or remembering facts.  Restlessness.  Irritability.  How is this diagnosed? Your health care provider can diagnose GAD based on your symptoms and medical history. You will also have a physical exam. The health care provider will ask specific questions about your symptoms, including how severe they are, when they started, and if they come and go. Your health care provider may ask you about your use of alcohol or drugs, including prescription medicines. Your health care provider may refer you to a mental health specialist for further evaluation. Your health care provider will do a thorough examination and may perform additional tests to rule out other possible causes of your symptoms. To be diagnosed with GAD, a person must have anxiety that:  Is out of his or her control.  Affects several different aspects of his or her life, such as work and relationships.  Causes distress that makes him or her unable to take part in normal activities.  Includes at least three physical symptoms of GAD, such as restlessness, fatigue, trouble concentrating, irritability, muscle tension, or sleep problems.  Before your health care provider can confirm a diagnosis of GAD, these symptoms must be present more days than they are not, and they must last for six months or longer. How is this treated? The following therapies are usually used to treat GAD:  Medicine.  Antidepressant medicine is usually prescribed for long-term daily control. Antianxiety medicines may be added in severe cases, especially when panic attacks occur.  Talk therapy (psychotherapy). Certain types of talk therapy can be helpful in treating GAD by providing support, education, and guidance. Options include: ? Cognitive behavioral therapy (CBT). People learn coping skills and techniques to ease their anxiety. They learn to identify unrealistic or negative thoughts and behaviors and to replace them with positive ones. ? Acceptance and commitment therapy (ACT). This treatment teaches people how to be mindful as a way to cope with unwanted thoughts and feelings. ? Biofeedback. This process trains you to manage your body's response (physiological response) through breathing techniques and relaxation methods. You will work with a therapist while machines are used to monitor your physical symptoms.  Stress management techniques. These include yoga, meditation, and exercise.  A mental health specialist can help determine which treatment is best for you. Some people see improvement with one type of therapy. However, other people require a combination of therapies. Follow these instructions at home:  Take over-the-counter and prescription medicines only as told by your health care provider.  Try to maintain a normal routine.  Try to anticipate stressful situations and allow extra time to manage them.  Practice any stress management or self-calming techniques as taught by your health care provider.  Do not punish yourself for setbacks or for not making progress.  Try to recognize your accomplishments, even if they are small.  Keep all follow-up visits as told by your health care provider. This is important. Contact a health care provider if:  Your symptoms do not get better.  Your symptoms get worse.  You have signs of depression, such as: ? A persistently sad, cranky, or irritable  mood. ? Loss of enjoyment in activities that used to bring you joy. ? Change in weight or eating. ? Changes in sleeping habits. ? Avoiding friends or family members. ? Loss of energy for normal tasks. ? Feelings of guilt or worthlessness. Get help right away if:  You have serious thoughts about hurting yourself or others. If you ever feel like you may hurt yourself or others, or have thoughts about taking your own life, get help right away. You can go to your nearest emergency department or call:  Your local emergency services (911 in the [place]  A suicide crisis helpline, such as the National Suicide Prevention Lifeline at 712-728-03411-778-422-1087. This is open 24 hours a day.  Summary  Generalized anxiety disorder (GAD) is a mental health disorder that involves worry that is not triggered by a specific event.  People with GAD often worry excessively about many things in their lives, such as their health and family.  GAD may cause physical symptoms such as restlessness, trouble concentrating, sleep problems, frequent sweating, nausea, diarrhea, headaches, and trembling or muscle twitching.  A mental health specialist can  help determine which treatment is best for you. Some people see improvement with one type of therapy. However, other people require a combination of therapies. This information is not intended to replace advice given to you by your health care provider. Make sure you discuss any questions you have with your health care provider. Document Released: 02/19/2013 Document Revised: 09/14/2016 Document Reviewed: 09/14/2016 Elsevier Interactive Patient Education  [employer].

## 2017-09-27 ENCOUNTER — Other Ambulatory Visit: Payer: Self-pay

## 2017-09-27 ENCOUNTER — Emergency Department (HOSPITAL_COMMUNITY): Payer: PRIVATE HEALTH INSURANCE

## 2017-09-27 ENCOUNTER — Encounter (HOSPITAL_COMMUNITY): Payer: Self-pay | Admitting: *Deleted

## 2017-09-27 ENCOUNTER — Emergency Department (HOSPITAL_COMMUNITY)
Admission: EM | Admit: 2017-09-27 | Discharge: 2017-09-27 | Disposition: A | Discharge status: Home / Self Care | Payer: PRIVATE HEALTH INSURANCE | Attending: Emergency Medicine | Admitting: Emergency Medicine

## 2017-09-27 DIAGNOSIS — Y929 Unspecified place or not applicable: Secondary | ICD-10-CM | POA: Insufficient documentation

## 2017-09-27 DIAGNOSIS — F17210 Nicotine dependence, cigarettes, uncomplicated: Secondary | ICD-10-CM | POA: Insufficient documentation

## 2017-09-27 DIAGNOSIS — Y9389 Activity, other specified: Secondary | ICD-10-CM | POA: Insufficient documentation

## 2017-09-27 DIAGNOSIS — S93124A Dislocation of metatarsophalangeal joint of right lesser toe(s), initial encounter: Secondary | ICD-10-CM | POA: Insufficient documentation

## 2017-09-27 DIAGNOSIS — Y998 Other external cause status: Secondary | ICD-10-CM | POA: Insufficient documentation

## 2017-09-27 DIAGNOSIS — W1849XA Other slipping, tripping and stumbling without falling, initial encounter: Secondary | ICD-10-CM | POA: Insufficient documentation

## 2017-09-27 DIAGNOSIS — S99921A Unspecified injury of right foot, initial encounter: Secondary | ICD-10-CM | POA: Diagnosis present

## 2017-09-27 DIAGNOSIS — S93104A Unspecified dislocation of right toe(s), initial encounter: Secondary | ICD-10-CM

## 2017-09-27 MED ORDER — IBUPROFEN 800 MG PO TABS: 800.0000 mg | ORAL_TABLET | Freq: Three times a day (TID) | ORAL | 0 refills | Status: DC

## 2017-09-27 MED ORDER — OXYCODONE-ACETAMINOPHEN 5-325 MG PO TABS
2.0000 | ORAL_TABLET | Freq: Once | ORAL | Status: AC
  Administered 2017-09-27: 2 via ORAL
  Filled 2017-09-27: qty 2

## 2017-09-27 NOTE — ED Provider Notes (Signed)
[facility]PART[facility] Provider Note   CSN: [id number] Arrival date & time: 09/27/17  0250     History   Chief Complaint Chief Complaint  Patient presents with  . Ankle Injury    HPI Cory Davis is a 26 y.o. male.  Patient presents with foot injury after tripping while going down the stairs.  States he stubbed his foot on a step after missing the last several steps and caught himself before falling.  Did not hit head or lose consciousness.  No neck or back pain.  Complains of pain to his right second third and fourth toes.  Mild pain to his ankle as well.  No proximal leg pain or knee pain.  No numbness or tingling.  No open wounds.   The history is provided by the patient.  Ankle Injury  Pertinent negatives include no chest pain, no abdominal pain and no headaches.    Past Medical History:  Diagnosis Date  . Chronic back pain   . Chronic knee pain   . Prediabetes   . Renal disorder   . Sciatica     Patient Active Problem List   Diagnosis Date Noted  . TEAR LATERAL MENISCUS 01/10/2008  . L C L SPRAIN 01/10/2008  . M C L SPRAIN 01/10/2008  . TEAR A C L 01/10/2008    Past Surgical History:  Procedure Laterality Date  . ANTERIOR CRUCIATE LIGAMENT REPAIR    . KNEE ARTHROPLASTY         Home Medications    Prior to Admission medications   Medication Sig Start Date End Date Taking? Authorizing Provider  buPROPion [facility](WELLBUTRIN SR) 150 MG 12 hr tablet One po daily for three days,then one BID 09/23/17   Merlyn AlbertLuking, William S, MD    Family History Family History  Problem Relation Age of Onset  . Diabetes Other     Social History Social History   Tobacco Use  . Smoking status: Current Every Day Smoker    Packs/day: 0.50    Years: 10.00    Pack years: 5.00    Types: Cigarettes  . Smokeless tobacco: Former Engineer, waterUser  Substance Use Topics  . Alcohol use: Yes    Comment: occ  . Drug use: No     Allergies   Patient has no known  allergies.   Review of Systems Review of Systems  Constitutional: Negative for activity change, appetite change and fever.  HENT: Negative for congestion and rhinorrhea.   Respiratory: Negative for cough and chest tightness.   Cardiovascular: Negative for chest pain.  Gastrointestinal: Negative for abdominal pain, nausea and vomiting.  Genitourinary: Negative for dysuria, hematuria and testicular pain.  Musculoskeletal: Positive for arthralgias and myalgias.  Skin: Negative for pallor and rash.  Neurological: Negative for dizziness, weakness and headaches.    all other systems are negative except as noted in the HPI and PMH.    Physical Exam Updated Vital Signs BP 129/90 (BP Location: Right Arm)   Pulse 88   Temp 98.1 F (36.7 C) (Oral)   Resp 18   Ht 6\' 2"  (1.88 m)   Wt 113.4 kg (250 lb)   SpO2 97%   BMI 32.10 kg/m   Physical Exam  Constitutional: He is oriented to person, place, and time. He appears well-developed and well-nourished. No distress.  HENT:  Head: Normocephalic and atraumatic.  Mouth/Throat: Oropharynx is clear and moist. No oropharyngeal exudate.  Eyes: Conjunctivae and EOM are normal. Pupils are equal, round, and  reactive to light.  Neck: Normal range of motion. Neck supple.  No meningismus.  Cardiovascular: Normal rate, regular rhythm, normal heart sounds and intact distal pulses.  No murmur heard. Pulmonary/Chest: Effort normal and breath sounds normal. No respiratory distress.  Abdominal: Soft. There is no tenderness. There is no rebound and no guarding.  Musculoskeletal: Normal range of motion. He exhibits tenderness and deformity. He exhibits no edema.  No proximal fibula tenderness.  No lateral or medial malleolar tenderness.  Achilles is intact. Intact DP and PT pulses.  No base of fifth metatarsal tenderness  Tenderness to palpation of the second third and fourth toes without erythema or edema.  There is apparent dorsal dislocation of the third  MTP joint  Neurological: He is alert and oriented to person, place, and time. No cranial nerve deficit. He exhibits normal muscle tone. Coordination normal.   5/5 strength throughout. CN 2-12 intact.Equal grip strength.   Skin: Skin is warm.  Psychiatric: He has a normal mood and affect. His behavior is normal.  Nursing note and vitals reviewed.    ED Treatments / Results  Labs (all labs ordered are listed, but only abnormal results are displayed) Labs Reviewed - No data to display  EKG  EKG Interpretation None       Radiology Dg Ankle Complete Right  Result Date: 09/27/2017 CLINICAL DATA:  Patient fell down stairs today. Pain in the right foot. History of ankle fracture. EXAM: RIGHT FOOT COMPLETE - 3+ VIEW; RIGHT ANKLE - COMPLETE 3+ VIEW COMPARISON:  None. FINDINGS: Three views of the right foot and three views of the right ankle are obtained. Mild soft tissue swelling about the anterior right ankle. No evidence of acute fracture or dislocation of the right foot or ankle. No focal bone lesion or bone destruction. Bone cortex and trabecular architecture appear intact. IMPRESSION: No acute bony abnormalities demonstrated in the right foot and ankle. Electronically Signed   By: Burman NievesWilliam  Stevens M.D.   On: 09/27/2017 03:21   Dg Foot Complete Right  Result Date: 09/27/2017 CLINICAL DATA:  Patient fell down stairs today. Pain in the right foot. History of ankle fracture. EXAM: RIGHT FOOT COMPLETE - 3+ VIEW; RIGHT ANKLE - COMPLETE 3+ VIEW COMPARISON:  None. FINDINGS: Three views of the right foot and three views of the right ankle are obtained. Mild soft tissue swelling about the anterior right ankle. No evidence of acute fracture or dislocation of the right foot or ankle. No focal bone lesion or bone destruction. Bone cortex and trabecular architecture appear intact. IMPRESSION: No acute bony abnormalities demonstrated in the right foot and ankle. Electronically Signed   By: Burman NievesWilliam  Stevens  M.D.   On: 09/27/2017 03:21   Dg Toe 3rd Right  Result Date: 09/27/2017 CLINICAL DATA:  Initial evaluation status post reduction. EXAM: RIGHT THIRD TOE COMPARISON:  Prior radiograph from earlier same day. FINDINGS: Right third MTP joint has been reduced, now in normal anatomic alignment. Small chip fracture present at the medial aspect of the base of the right third proximal phalanx. No other acute osseous abnormality. IMPRESSION: 1. Right third MTP joint in normal anatomic alignment status post reduction. 2. Associated small chip fracture at the medial aspect of the base of the right third proximal phalanx. Electronically Signed   By: Rise MuBenjamin  McClintock M.D.   On: 09/27/2017 04:12    Procedures Reduction of dislocation Date/Time: 09/27/2017 3:44 AM Performed by: Glynn Octaveancour, Shweta Aman, MD Authorized by: Glynn Octaveancour, Teairra Millar, MD  Consent: Verbal consent obtained.  Risks and benefits: risks, benefits and alternatives were discussed Consent given by: patient Patient understanding: patient states understanding of the procedure being performed Site marked: the operative site was marked Imaging studies: imaging studies available Patient identity confirmed: verbally with patient and provided demographic data Time out: Immediately prior to procedure a "time out" was called to verify the correct patient, procedure, equipment, support staff and site/side marked as required. Local anesthesia used: no  Anesthesia: Local anesthesia used: no  Sedation: Patient sedated: no  Patient tolerance: Patient tolerated the procedure well with no immediate complications Comments: Reduction of R 3rd MTP dislocation with gentle traction    (including critical care time)  Medications Ordered in ED Medications  oxyCODONE-acetaminophen (PERCOCET/ROXICET) 5-325 MG per tablet 2 tablet (not administered)     Initial Impression / Assessment and Plan / ED Course  I have reviewed the triage vital signs and the nursing  notes.  Pertinent labs & imaging results that were available during my care of the patient were reviewed by me and considered in my medical decision making (see chart for details).    Patient with toe injury after missing several steps.  No head or neck injury.  Neurovascularly intact.  Third MTP dislocation reduced on initial exam. X-rays otherwise negative. Postreduction x-ray shows a small chip avulsion from proximal phalanx.  Patient given buddy tape, cast shoe, orthopedic follow-up.  Return precautions discussed  Final Clinical Impressions(s) / ED Diagnoses   Final diagnoses:  Toe dislocation, right, initial encounter    ED Discharge Orders    None       Bekki Tavenner, Jeannett Senior, MD 09/27/17 [phone]

## 2017-09-27 NOTE — ED Triage Notes (Signed)
Pt c/o pain and swelling to right foot after missing the last three steps and stepping wrong; pedal pulse present

## 2017-09-28 ENCOUNTER — Encounter: Payer: Self-pay | Admitting: Orthopaedic Surgery

## 2017-09-28 ENCOUNTER — Ambulatory Visit (INDEPENDENT_AMBULATORY_CARE_PROVIDER_SITE_OTHER): Payer: PRIVATE HEALTH INSURANCE | Admitting: Orthopaedic Surgery

## 2017-09-28 ENCOUNTER — Ambulatory Visit: Payer: PRIVATE HEALTH INSURANCE | Admitting: Family Medicine

## 2017-09-28 DIAGNOSIS — S92514A Nondisplaced fracture of proximal phalanx of right lesser toe(s), initial encounter for closed fracture: Secondary | ICD-10-CM | POA: Diagnosis not present

## 2017-09-28 MED ORDER — HYDROCODONE-ACETAMINOPHEN 5-325 MG PO TABS: ORAL_TABLET | ORAL | 0 refills | Status: DC

## 2017-09-28 NOTE — Progress Notes (Signed)
Subjective:    Patient ID: Cory Davis, male    DOB: June 03, 1991, 26 y.o.   MRN: [id number]  HPI He took a misstep and hurt his right foot, third toe.  He had a dislocation of the toe at the MTP joint.  He was seen in the ER and had reduction.  X-rays showed: IMPRESSION: 1. Right third MTP joint in normal anatomic alignment status post reduction. 2. Associated small chip fracture at the medial aspect of the base of the right third proximal phalanx.  He had his toes buddy taped in the ER.  I have reviewed the ER records, X-rays and reports.  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  He will be out of work at least the next three weeks.  I have explained how to buddy tape the toe.  I have given CAM walker to use.     Review of Systems  HENT: Negative for congestion.   Respiratory: Negative for cough and shortness of breath.   Cardiovascular: Negative for chest pain and leg swelling.  Endocrine: Negative for cold intolerance.  Musculoskeletal: Positive for arthralgias and gait problem.  Allergic/Immunologic: Negative for environmental allergies.   Past Medical History:  Diagnosis Date  . Chronic back pain   . Chronic knee pain   . Prediabetes   . Renal disorder   . Sciatica     Past Surgical History:  Procedure Laterality Date  . ANTERIOR CRUCIATE LIGAMENT REPAIR    . KNEE ARTHROPLASTY      Current Outpatient Medications on File Prior to Visit  Medication Sig Dispense Refill  . buPROPion (WELLBUTRIN SR) 150 MG 12 hr tablet One po daily for three days,then one BID 63 tablet 6  . ibuprofen (ADVIL,MOTRIN) 800 MG tablet Take 1 tablet (800 mg total) 3 (three) times daily by mouth. 21 tablet 0   No current facility-administered medications on file prior to visit.     Social History   Socioeconomic History  . Marital status: Single    Spouse name: Not on file  . Number of children: Not on file  . Years of  education: Not on file  . Highest education level: Not on file  Social Needs  . Financial resource strain: Not on file  . Food insecurity - worry: Not on file  . Food insecurity - inability: Not on file  . Transportation needs - medical: Not on file  . Transportation needs - non-medical: Not on file  Occupational History  . Not on file  Tobacco Use  . Smoking status: Current Every Day Smoker    Packs/day: 0.50    Years: 10.00    Pack years: 5.00    Types: Cigarettes  . Smokeless tobacco: Former Engineer, waterUser  Substance and Sexual Activity  . Alcohol use: Yes    Comment: occ  . Drug use: No  . Sexual activity: Not on file  Other Topics Concern  . Not on file  Social History Narrative  . Not on file    Family History  Problem Relation Age of Onset  . Diabetes Other   . Diabetes Mother   . Hypertension Father   . Cancer Sister   . Diabetes Maternal Grandfather   . Hypertension Maternal Grandfather     Height 6'2", weight 254, afebrile, BP 146/87, pulse 86    Objective:   Physical Exam  Constitutional: He is oriented to person, place, and time. He  appears well-developed and well-nourished.  HENT:  Head: Normocephalic and atraumatic.  Eyes: Conjunctivae and EOM are normal. Pupils are equal, round, and reactive to light.  Neck: Normal range of motion. Neck supple.  Cardiovascular: Normal rate, regular rhythm and intact distal pulses.  Pulmonary/Chest: Effort normal.  Abdominal: Soft.  Musculoskeletal: He exhibits tenderness (Right third toe with pain and some swelling.  NV intact.  ROM decreased.  Limp to the right.).  Neurological: He is alert and oriented to person, place, and time. He has normal reflexes. He displays normal reflexes. No cranial nerve deficit. He exhibits normal muscle tone. Coordination normal.  Skin: Skin is warm and dry.  Psychiatric: He has a normal mood and affect. His behavior is normal. Judgment and thought content normal.  Vitals reviewed.          Assessment & Plan:   Encounter Diagnosis  Name Primary?  . Closed nondisplaced fracture of proximal phalanx of lesser toe of right foot, initial encounter Yes   Return in three weeks.  Out of work.  I have reviewed the [place] Cont[place] Substance Reporting System web site prior to prescribing narcotic medicine for this patient.  Call if any problem.  Precautions discussed.   Electronically Signed Darreld McleanWayne Laelyn Blumenthal, MD 11/21/20189:18 AM

## 2017-09-28 NOTE — Patient Instructions (Signed)
Out of work next three weeks, post dislocation and fracture right third toe

## 2017-10-04 ENCOUNTER — Encounter: Payer: Self-pay | Admitting: Family Medicine

## 2017-10-04 ENCOUNTER — Ambulatory Visit: Payer: PRIVATE HEALTH INSURANCE | Admitting: Family Medicine

## 2017-10-04 ENCOUNTER — Telehealth: Payer: Self-pay | Admitting: Orthopaedic Surgery

## 2017-10-04 VITALS — BP 120/86 | Temp 98.2°F | Ht 74.0 in | Wt 251.0 lb

## 2017-10-04 DIAGNOSIS — J329 Chronic sinusitis, unspecified: Secondary | ICD-10-CM | POA: Diagnosis not present

## 2017-10-04 DIAGNOSIS — J310 Chronic rhinitis: Secondary | ICD-10-CM

## 2017-10-04 MED ORDER — CEFDINIR 300 MG PO CAPS: 300.0000 mg | ORAL_CAPSULE | Freq: Two times a day (BID) | ORAL | 0 refills | Status: DC

## 2017-10-04 MED ORDER — HYDROCODONE-HOMATROPINE 5-1.5 MG/5ML PO SYRP: ORAL_SOLUTION | ORAL | 0 refills | Status: DC

## 2017-10-04 NOTE — Telephone Encounter (Signed)
Patient called to request to move currently scheduled appointment date 10/18/17, to earliest possible date for follow/up post dislocation and fracture right third toe, in order to be evaluated for release back to work. Please advise.

## 2017-10-04 NOTE — Progress Notes (Signed)
   Subjective:    Patient ID: Cory Davis, male    DOB: 03-07-1991, 26 y.o.   MRN: [id number]  HPI Patient is here today do to flu like symptoms. He states he has had fever,cough,left ear pain and runny nose for two days now. He has been taking mucinex,ibuprohen which has not seemed to help any.   Woke up two days ago,  Head hurting trouble breathing  Bad cough   Trouble sleeping   Fever came down yesterday  tmax 101 couple days ago   No known exposures to sickness  Review of Systems No headache, no major weight loss or weight gain, no chest pain no back pain abdominal pain no change in bowel habits complete ROS otherwise negative     Objective:   Physical Exam  Alert, mild malaise. Hydration good Vitals stable. frontal/ maxillary tenderness evident positive nasal congestion. pharynx normal neck supple  lungs clear/no crackles or wheezes. heart regular in rhythm       Assessment & Plan:  Impression rhinosinusitis likely post viral, discussed with patient. plan antibiotics prescribed. Questions answered. Symptomatic care discussed. warning signs discussed. WSL

## 2017-10-04 NOTE — Telephone Encounter (Signed)
If he is doing well and no pain and walking well and doing what he wants to do, he can return to work.

## 2017-10-05 ENCOUNTER — Encounter: Payer: Self-pay | Admitting: Orthopaedic Surgery

## 2017-10-05 ENCOUNTER — Ambulatory Visit (INDEPENDENT_AMBULATORY_CARE_PROVIDER_SITE_OTHER): Payer: PRIVATE HEALTH INSURANCE | Admitting: Orthopaedic Surgery

## 2017-10-05 VITALS — BP 132/81 | HR 100 | Temp 98.1°F | Ht 74.0 in | Wt 253.0 lb

## 2017-10-05 DIAGNOSIS — S92514D Nondisplaced fracture of proximal phalanx of right lesser toe(s), subsequent encounter for fracture with routine healing: Secondary | ICD-10-CM

## 2017-10-05 NOTE — Patient Instructions (Signed)
Return to work full duty 

## 2017-10-05 NOTE — Progress Notes (Signed)
CC:  My toe does not hurt  He has no pain with the right third toe.  He has no swelling.  He is walking well.  He wants to go back to work.  NV intact.  The toe looks normal.  He has no pain.  Encounter Diagnosis  Name Primary?  . Closed nondisplaced fracture of proximal phalanx of lesser toe of right foot with routine healing, subsequent encounter Yes   I will see him in three weeks.  X-rays on return.  Go to work full duty.  Call if any problem.  Precautions discussed.   Electronically Signed Darreld McleanWayne Gibson Lad, MD 11/28/20188:23 AM

## 2017-10-06 NOTE — Telephone Encounter (Signed)
Have called patient, unable to leave message; mailbox is full.

## 2017-10-10 ENCOUNTER — Telehealth: Payer: Self-pay | Admitting: Family Medicine

## 2017-10-10 MED ORDER — HYDROCODONE-HOMATROPINE 5-1.5 MG/5ML PO SYRP: ORAL_SOLUTION | ORAL | 0 refills | Status: DC

## 2017-10-10 MED ORDER — CEFDINIR 300 MG PO CAPS: 300.0000 mg | ORAL_CAPSULE | Freq: Two times a day (BID) | ORAL | 0 refills | Status: DC

## 2017-10-10 NOTE — Telephone Encounter (Signed)
Spoke with patient and informed him per Dr.Scott Luking-  We have a prescription for 90 ml 1 teaspoon twice a daily as needed for cough. Patient verbalized understanding.

## 2017-10-10 NOTE — Telephone Encounter (Signed)
Spoke with patient and informed him per Dr.Scott Luking- The cough medication was recommended 1 teaspoon each evening so if using it that way you should have enough for 2 weeks.Refill on antibiotic will be called in. Patient verbalized understanding and stated that he was taking it as needed and that he was told that day when he got the prescription that it was fine. Please advise?

## 2017-10-10 NOTE — Telephone Encounter (Signed)
Patient seen Dr. Brett CanalesSteve on 10/04/17 for rhinosinusitis.  He is still having a really bad cough, runny nose, congestion, slight fever between 99-100.  He is requesting a refill on his antibiotic and cough medication.  [facility]

## 2017-10-10 NOTE — Telephone Encounter (Signed)
The patient may have 90 mL's of cough medication 1 teaspoon twice daily as needed cough cautioned drowsiness

## 2017-10-10 NOTE — Telephone Encounter (Signed)
The cough medication was recommended 1 teaspoon each evening if he was using it that way he should have enough cough medicine to last for 2 weeks.  May refill antibiotic.

## 2017-10-11 ENCOUNTER — Encounter: Payer: Self-pay | Admitting: Nurse Practitioner

## 2017-10-11 ENCOUNTER — Ambulatory Visit (INDEPENDENT_AMBULATORY_CARE_PROVIDER_SITE_OTHER): Payer: PRIVATE HEALTH INSURANCE | Admitting: Nurse Practitioner

## 2017-10-11 VITALS — BP 122/82 | Ht 74.0 in | Wt 253.4 lb

## 2017-10-11 DIAGNOSIS — Z0000 Encounter for general adult medical examination without abnormal findings: Secondary | ICD-10-CM | POA: Diagnosis not present

## 2017-10-11 DIAGNOSIS — Z1159 Encounter for screening for other viral diseases: Secondary | ICD-10-CM | POA: Diagnosis not present

## 2017-10-11 DIAGNOSIS — R739 Hyperglycemia, unspecified: Secondary | ICD-10-CM

## 2017-10-11 DIAGNOSIS — Z114 Encounter for screening for human immunodeficiency virus [HIV]: Secondary | ICD-10-CM

## 2017-10-11 DIAGNOSIS — Z113 Encounter for screening for infections with a predominantly sexual mode of transmission: Secondary | ICD-10-CM | POA: Diagnosis not present

## 2017-10-11 DIAGNOSIS — K219 Gastro-esophageal reflux disease without esophagitis: Secondary | ICD-10-CM

## 2017-10-11 DIAGNOSIS — Z13220 Encounter for screening for lipoid disorders: Secondary | ICD-10-CM | POA: Diagnosis not present

## 2017-10-11 MED ORDER — PANTOPRAZOLE SODIUM 40 MG PO TBEC: 40.0000 mg | DELAYED_RELEASE_TABLET | Freq: Every day | ORAL | 2 refills | Status: DC

## 2017-10-11 NOTE — Patient Instructions (Addendum)
OTC antihistamine (claritin or allegra) Nasacort AQ    Gastroesophageal Reflux Disease, Adult Normally, food travels down the esophagus and stays in the stomach to be digested. If a person has gastroesophageal reflux disease (GERD), food and stomach acid move back up into the esophagus. When this happens, the esophagus becomes sore and swollen (inflamed). Over time, GERD can make small holes (ulcers) in the lining of the esophagus. Follow these instructions at home: Diet  Follow a diet as told by your doctor. You may need to avoid foods and drinks such as: ? Coffee and tea (with or without caffeine). ? Drinks that contain alcohol. ? Energy drinks and sports drinks. ? Carbonated drinks or sodas. ? Chocolate and cocoa. ? Peppermint and mint flavorings. ? Garlic and onions. ? Horseradish. ? Spicy and acidic foods, such as peppers, chili powder, curry powder, vinegar, hot sauces, and BBQ sauce. ? Citrus fruit juices and citrus fruits, such as oranges, lemons, and limes. ? Tomato-based foods, such as red sauce, chili, salsa, and pizza with red sauce. ? Fried and fatty foods, such as donuts, french fries, potato chips, and high-fat dressings. ? High-fat meats, such as hot dogs, rib eye steak, sausage, ham, and bacon. ? High-fat dairy items, such as whole milk, butter, and cream cheese.  Eat small meals often. Avoid eating large meals.  Avoid drinking large amounts of liquid with your meals.  Avoid eating meals during the 2-3 hours before bedtime.  Avoid lying down right after you eat.  Do not exercise right after you eat. General instructions  Pay attention to any changes in your symptoms.  Take over-the-counter and prescription medicines only as told by your doctor. Do not take aspirin, ibuprofen, or other NSAIDs unless your doctor says it is okay.  Do not use any tobacco products, including cigarettes, chewing tobacco, and e-cigarettes. If you need help quitting, ask your  doctor.  Wear loose clothes. Do not wear anything tight around your waist.  Raise (elevate) the head of your bed about 6 inches (15 cm).  Try to lower your stress. If you need help doing this, ask your doctor.  If you are overweight, lose an amount of weight that is healthy for you. Ask your doctor about a safe weight loss goal.  Keep all follow-up visits as told by your doctor. This is important. Contact a doctor if:  You have new symptoms.  You lose weight and you do not know why it is happening.  You have trouble swallowing, or it hurts to swallow.  You have wheezing or a cough that keeps happening.  Your symptoms do not get better with treatment.  You have a hoarse voice. Get help right away if:  You have pain in your arms, neck, jaw, teeth, or back.  You feel sweaty, dizzy, or light-headed.  You have chest pain or shortness of breath.  You throw up (vomit) and your throw up looks like blood or coffee grounds.  You pass out (faint).  Your poop (stool) is bloody or black.  You cannot swallow, drink, or eat. This information is not intended to replace advice given to you by your health care provider. Make sure you discuss any questions you have with your health care provider. Document Released: 04/12/2008 Document Revised: 04/01/2016 Document Reviewed: 02/19/2015 Elsevier Interactive Patient Education  2018 [employer]     Food Choices for Gastroesophageal Reflux Disease, Adult When you have gastroesophageal reflux disease (GERD), the foods you eat and your eating habits are  very important. Choosing the right foods can help ease your discomfort. What guidelines do I need to follow?  Choose fruits, vegetables, whole grains, and low-fat dairy products.  Choose low-fat meat, fish, and poultry.  Limit fats such as oils, salad dressings, butter, nuts, and avocado.  Keep a food diary. This helps you identify foods that cause symptoms.  Avoid foods that cause  symptoms. These may be different for everyone.  Eat small meals often instead of 3 large meals a day.  Eat your meals slowly, in a place where you are relaxed.  Limit fried foods.  Cook foods using methods other than frying.  Avoid drinking alcohol.  Avoid drinking large amounts of liquids with your meals.  Avoid bending over or lying down until 2-3 hours after eating. What foods are not recommended? These are some foods and drinks that may make your symptoms worse: Vegetables Tomatoes. Tomato juice. Tomato and spaghetti sauce. Chili peppers. Onion and garlic. Horseradish. Fruits Oranges, grapefruit, and lemon (fruit and juice). Meats High-fat meats, fish, and poultry. This includes hot dogs, ribs, ham, sausage, salami, and bacon. Dairy Whole milk and chocolate milk. Sour cream. Cream. Butter. Ice cream. Cream cheese. Drinks Coffee and tea. Bubbly (carbonated) drinks or energy drinks. Condiments Hot sauce. Barbecue sauce. Sweets/Desserts Chocolate and cocoa. Donuts. Peppermint and spearmint. Fats and Oils High-fat foods. This includes [place] fries and potato chips. Other Vinegar. Strong spices. This includes black pepper, white pepper, red pepper, cayenne, curry powder, cloves, ginger, and chili powder. The items listed above may not be a complete list of foods and drinks to avoid. Contact your dietitian for more information. This information is not intended to replace advice given to you by your health care provider. Make sure you discuss any questions you have with your health care provider. Document Released: 04/25/2012 Document Revised: 04/01/2016 Document Reviewed: 08/29/2013 Elsevier Interactive Patient Education  2017 [employer]

## 2017-10-12 ENCOUNTER — Encounter: Payer: Self-pay | Admitting: Nurse Practitioner

## 2017-10-12 DIAGNOSIS — K219 Gastro-esophageal reflux disease without esophagitis: Secondary | ICD-10-CM | POA: Insufficient documentation

## 2017-10-12 NOTE — Progress Notes (Signed)
Subjective:    Patient ID: Cory Davis, male    DOB: 27-Jul-1991, 28 y.o.   MRN: [id number]  HPI presents for his wellness exam. Has been with his current male sexual partner x 6 months. Exercises 5-6 days per week. Also has active job. Takes wellbutrin BID; takes second dose in the evening. Has trouble going to sleep but fine after that. Tried Melatonin but had side effects.  Overall pleased with how Wellbutrin is working.  Has a history of headaches following trauma while he was in the [employer], these have been much better.  Regular vision and dental exams.  Has had some slight head congestion for the past 10 days.  Clear mucus.  No fever.  Minimal cough.  No sinus headache.  No ear pain or sore throat.  Has been having frequent reflux, took omeprazole at one time.  States he eats Tums "like candy".  Drinks a large amount of caffeine including Monster drinks.  No alcohol intake.  Smokes half a pack per day or less.  Occasional anti-inflammatory, does not take this every day.  Drinks orange juice.  Noticed symptoms are worse with acidic foods such as tomatoes.  No abdominal pain.  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  States that his EGD showed some scarring in the stomach. States he had an elevated A1C while in the [employer].     Review of Systems  Constitutional: Negative for activity change, appetite change, fatigue and fever.  HENT: Positive for postnasal drip and rhinorrhea. Negative for dental problem, ear pain, sinus pressure and sore throat.   Respiratory: Negative for cough, chest tightness, shortness of breath and wheezing.   Cardiovascular: Negative for chest pain.  Gastrointestinal: Negative for abdominal distention, abdominal pain, constipation, diarrhea, nausea and vomiting.  Genitourinary: Negative for difficulty urinating, discharge, dysuria, enuresis, frequency, genital sores, penile pain, penile swelling, scrotal swelling,  testicular pain and urgency.  Psychiatric/Behavioral: Positive for sleep disturbance.   [facility] 09/23/2017  Decreased Interest 0  Down, Depressed, Hopeless 0  PHQ - 2 Score 0  Altered sleeping 1  Tired, decreased energy 1  Change in appetite 0  Feeling bad or failure about yourself  0  Trouble concentrating 1  Moving slowly or fidgety/restless 1  Suicidal thoughts 1  PHQ-9 Score 5  Difficult doing work/chores Somewhat difficult        Objective:   Physical Exam  Constitutional: He is oriented to person, place, and time. He appears well-developed and well-nourished.  HENT:  Right Ear: External ear normal.  Left Ear: External ear normal.  Mouth/Throat: Oropharynx is clear and moist.  Neck: Normal range of motion. Neck supple. No tracheal deviation present. No thyromegaly present.  Cardiovascular: Normal rate, regular rhythm and normal heart sounds.  Pulmonary/Chest: Effort normal and breath sounds normal.  Abdominal: Soft. He exhibits no distension and no mass. There is no tenderness. There is no rebound and no guarding.  Genitourinary:  Genitourinary Comments: Defers GU exam, denies any problems.  Musculoskeletal: Normal range of motion.  Lymphadenopathy:    He has no cervical adenopathy.  Neurological: He is alert and oriented to person, place, and time.  Skin: Skin is warm and dry. No rash noted.  Psychiatric: He has a normal mood and affect. His behavior is normal. Thought content normal.  Vitals reviewed.         Assessment & Plan:   Problem List Items Addressed This  Visit      Digestive   Gastroesophageal reflux disease without esophagitis   Relevant Medications   pantoprazole (PROTONIX) 40 MG tablet   Other Relevant Orders   Hepatic function panel    Other Visit Diagnoses    Well adult exam    -  Primary   Screening examination for STD (sexually transmitted disease)       Relevant Orders   Chlamydia/Gonococcus/Trichomonas, NAA   RPR    Screening for HIV (human immunodeficiency virus)       Relevant Orders   HIV antibody   Encounter for hepatitis C screening test for low risk patient       Relevant Orders   Hepatitis C Antibody   Screening, lipid       Relevant Orders   Lipid panel   Hyperglycemia       Relevant Orders   Basic metabolic panel   Hemoglobin A1c     Meds ordered this encounter  Medications  . pantoprazole (PROTONIX) 40 MG tablet    Sig: Take 1 tablet (40 mg total) by mouth daily. Prn acid reflux    Dispense:  30 tablet    Refill:  2    Order Specific Question:   Supervising Provider    Answer:   Merlyn AlbertLUKING, WILLIAM S [[id number]]   Slowly wean off caffeine and continue to limit or stop tobacco use. Limit citrus and spicy foods. Given written and verbal information on reflux disease. Start daily Protonix. Call back in one month if reflux persists. Agrees to STD screening. Will recheck A1C based on history. Advised patient to take second dose of Wellbutrin earlier around 8 hours after first dose to see if this will help sleep. Call back if no improvement. Labs pending. Return in about 1 year (around 10/11/2018) for physical.

## 2017-10-18 ENCOUNTER — Ambulatory Visit: Payer: PRIVATE HEALTH INSURANCE | Admitting: Orthopaedic Surgery

## 2017-10-21 NOTE — Telephone Encounter (Signed)
Have been calling patient, tried again 9:56am today, 10/21/17 - voice mail is full; still unable to leave a message.  Called alternate contact, mother Babette Relicammy H, states will give message to return our call.

## 2017-10-23 ENCOUNTER — Emergency Department (HOSPITAL_COMMUNITY)
Admission: EM | Admit: 2017-10-23 | Discharge: 2017-10-23 | Disposition: A | Discharge status: Home / Self Care | Payer: PRIVATE HEALTH INSURANCE | Attending: Emergency Medicine | Admitting: Emergency Medicine

## 2017-10-23 ENCOUNTER — Encounter (HOSPITAL_COMMUNITY): Payer: Self-pay | Admitting: *Deleted

## 2017-10-23 DIAGNOSIS — Z96659 Presence of unspecified artificial knee joint: Secondary | ICD-10-CM | POA: Diagnosis not present

## 2017-10-23 DIAGNOSIS — Z79899 Other long term (current) drug therapy: Secondary | ICD-10-CM | POA: Diagnosis not present

## 2017-10-23 DIAGNOSIS — F17210 Nicotine dependence, cigarettes, uncomplicated: Secondary | ICD-10-CM | POA: Insufficient documentation

## 2017-10-23 DIAGNOSIS — B029 Zoster without complications: Secondary | ICD-10-CM | POA: Insufficient documentation

## 2017-10-23 DIAGNOSIS — R21 Rash and other nonspecific skin eruption: Secondary | ICD-10-CM | POA: Diagnosis present

## 2017-10-23 MED ORDER — HYDROCODONE-ACETAMINOPHEN 5-325 MG PO TABS: 1.0000 | ORAL_TABLET | ORAL | 0 refills | Status: DC | PRN

## 2017-10-23 MED ORDER — ACYCLOVIR 800 MG PO TABS: 800.0000 mg | ORAL_TABLET | Freq: Every day | ORAL | 0 refills | Status: DC

## 2017-10-23 MED ORDER — ACYCLOVIR 800 MG PO TABS
800.0000 mg | ORAL_TABLET | Freq: Once | ORAL | Status: AC
  Administered 2017-10-23: 800 mg via ORAL
  Filled 2017-10-23: qty 1

## 2017-10-23 NOTE — ED Triage Notes (Signed)
Pt has a rash on his right thigh. Pt has a hx of shingles.

## 2017-10-23 NOTE — Discharge Instructions (Signed)
Take the acyclovir as prescribed until gone.  You may take the hydrocodone prescribed for pain relief.  This will make you drowsy - do not drive within 4 hours of taking this medication.

## 2017-10-25 NOTE — ED Provider Notes (Signed)
[facility] EMERGENCY DEPARTMENT Provider Note   CSN: [id number] Arrival date & time: 10/23/17  2148     History   Chief Complaint Chief Complaint  Patient presents with  . Rash    HPI Cory Davis is a 26 y.o. male.  The history is provided by the patient.  Rash   This is a recurrent problem. The current episode started 2 days ago. The problem has been gradually worsening. The problem is associated with nothing (He reports has been caring for a grandparent that currently has shingles.  However, he endorses past hx of shingles as well and pain is similar.). There has been no fever. The rash is present on the right upper leg. The pain is at a severity of 3/10 (reports constant sharp, shooting pain prior to outbreak of rash). The pain is moderate. The pain has been constant since onset. Associated symptoms include blisters. He has tried nothing for the symptoms.    Past Medical History:  Diagnosis Date  . Chronic back pain   . Chronic knee pain   . Prediabetes   . Renal disorder   . Sciatica     Patient Active Problem List   Diagnosis Date Noted  . Gastroesophageal reflux disease without esophagitis 10/12/2017  . TEAR LATERAL MENISCUS 01/10/2008  . L C L SPRAIN 01/10/2008  . M C L SPRAIN 01/10/2008  . TEAR A C L 01/10/2008    Past Surgical History:  Procedure Laterality Date  . ANTERIOR CRUCIATE LIGAMENT REPAIR    . KNEE ARTHROPLASTY         Home Medications    Prior to Admission medications   Medication Sig Start Date End Date Taking? Authorizing Provider  acyclovir (ZOVIRAX) 800 MG tablet Take 1 tablet (800 mg total) by mouth 5 (five) times daily. 10/23/17   Burgess AmorIdol, Mirta Mally, PA-C  buPROPion [facility](WELLBUTRIN SR) 150 MG 12 hr tablet One po daily for three days,then one BID 09/23/17   Merlyn AlbertLuking, William S, MD  HYDROcodone-acetaminophen (NORCO/VICODIN) 5-325 MG tablet Take 1 tablet by mouth every 4 (four) hours as needed. 10/23/17   Burgess AmorIdol, Lorenzo Arscott, PA-C    HYDROcodone-acetaminophen (NORCO/VICODIN) 5-325 MG tablet Take 1 tablet by mouth every 4 (four) hours as needed. 10/23/17   Burgess AmorIdol, Tyerra Loretto, PA-C  ibuprofen (ADVIL,MOTRIN) 800 MG tablet Take 1 tablet (800 mg total) 3 (three) times daily by mouth. 09/27/17   Rancour, Jeannett SeniorStephen, MD  pantoprazole (PROTONIX) 40 MG tablet Take 1 tablet (40 mg total) by mouth daily. Prn acid reflux 10/11/17   Campbell RichesHoskins, Carolyn C, NP    Family History Family History  Problem Relation Age of Onset  . Diabetes Other   . Diabetes Mother   . Hypertension Father   . Cancer Sister   . Diabetes Maternal Grandfather   . Hypertension Maternal Grandfather     Social History Social History   Tobacco Use  . Smoking status: Current Every Day Smoker    Packs/day: 0.50    Years: 10.00    Pack years: 5.00    Types: Cigarettes  . Smokeless tobacco: Former Engineer, waterUser  Substance Use Topics  . Alcohol use: No    Frequency: Never    Comment: occ  . Drug use: No     Allergies   Patient has no known allergies.   Review of Systems Review of Systems  Constitutional: Negative for chills and fever.  Respiratory: Negative for shortness of breath and wheezing.   Skin: Positive for rash.  Neurological: Negative for  numbness.     Physical Exam Updated Vital Signs BP 134/84 (BP Location: Right Arm)   Temp 98.2 F (36.8 C) (Oral)   Resp 18   Ht 6\' 2"  (1.88 m)   Wt 114.8 kg (253 lb)   BMI 32.48 kg/m   Physical Exam  Constitutional: He appears well-developed and well-nourished. No distress.  HENT:  Head: Normocephalic.  Neck: Neck supple.  Cardiovascular: Normal rate.  Pulmonary/Chest: Effort normal. He has no wheezes.  Musculoskeletal: Normal range of motion. He exhibits no edema.  Skin: Rash noted. Rash is papular and vesicular.  Erythematous papular rash in clusters right upper to middle anterior thigh. Pain also at right lateral hip without signs of rash. Several small vesicles present.     ED Treatments /  Results  Labs (all labs ordered are listed, but only abnormal results are displayed) Labs Reviewed - No data to display  EKG  EKG Interpretation None       Radiology No results found.  Procedures Procedures (including critical care time)  Medications Ordered in ED Medications  acyclovir (ZOVIRAX) tablet 800 mg (800 mg Oral Given 10/23/17 2215)     Initial Impression / Assessment and Plan / ED Course  I have reviewed the triage vital signs and the nursing notes.  Pertinent labs & imaging results that were available during my care of the patient were reviewed by me and considered in my medical decision making (see chart for details).     Exam and hx strongly suggesting shingles, esp with pt prior hx of same with like sx.  He does report outbreak is a new location, however, last outbreak right shoulder. Acyclovir started, hydrocodone prn pain, plan f/u with pcp for a recheck within 1 week.  Final Clinical Impressions(s) / ED Diagnoses   Final diagnoses:  Herpes zoster without complication    ED Discharge Orders        Ordered    acyclovir (ZOVIRAX) 800 MG tablet  5 times daily     10/23/17 2205    HYDROcodone-acetaminophen (NORCO/VICODIN) 5-325 MG tablet  Every 4 hours PRN     10/23/17 2205    HYDROcodone-acetaminophen (NORCO/VICODIN) 5-325 MG tablet  Every 4 hours PRN     10/23/17 2210       Burgess Amordol, Maudene Stotler, PA-C 10/25/17 2353    Doug SouJacubowitz, Sam, MD 10/27/17 [phone]

## 2017-10-25 NOTE — Telephone Encounter (Signed)
No response as of yet. Patient's appointment is scheduled tomorrow, 10/26/17; will be addressed at visit.

## 2017-10-26 ENCOUNTER — Ambulatory Visit: Payer: PRIVATE HEALTH INSURANCE | Admitting: Orthopaedic Surgery

## 2017-10-27 ENCOUNTER — Ambulatory Visit (INDEPENDENT_AMBULATORY_CARE_PROVIDER_SITE_OTHER): Payer: PRIVATE HEALTH INSURANCE | Admitting: Family Medicine

## 2017-10-27 ENCOUNTER — Telehealth: Payer: Self-pay | Admitting: Orthopaedic Surgery

## 2017-10-27 ENCOUNTER — Ambulatory Visit: Payer: PRIVATE HEALTH INSURANCE | Admitting: Orthopaedic Surgery

## 2017-10-27 ENCOUNTER — Encounter: Payer: Self-pay | Admitting: Family Medicine

## 2017-10-27 ENCOUNTER — Ambulatory Visit: Payer: PRIVATE HEALTH INSURANCE | Admitting: Family Medicine

## 2017-10-27 ENCOUNTER — Ambulatory Visit (HOSPITAL_COMMUNITY): Payer: PRIVATE HEALTH INSURANCE | Admitting: Licensed Clinical Social Worker

## 2017-10-27 VITALS — BP 124/80 | Temp 98.1°F | Ht 74.0 in | Wt 254.0 lb

## 2017-10-27 DIAGNOSIS — B028 Zoster with other complications: Secondary | ICD-10-CM | POA: Diagnosis not present

## 2017-10-27 DIAGNOSIS — F4310 Post-traumatic stress disorder, unspecified: Secondary | ICD-10-CM

## 2017-10-27 DIAGNOSIS — R454 Irritability and anger: Secondary | ICD-10-CM

## 2017-10-27 MED ORDER — BUPROPION HCL ER (SR) 150 MG PO TB12: ORAL_TABLET | ORAL | 5 refills | Status: DC

## 2017-10-27 MED ORDER — HYDROCODONE-ACETAMINOPHEN 5-325 MG PO TABS: 1.0000 | ORAL_TABLET | Freq: Four times a day (QID) | ORAL | 0 refills | Status: DC | PRN

## 2017-10-27 NOTE — Progress Notes (Signed)
   Subjective:    Patient ID: West J WStark Brayooten, male    DOB: 02-Aug-1991, 26 y.o.   MRN: [id number]  HPIER follow up on shingles. Shingles on right thigh.   Wants to get shingles vaccine.   wellbutrin has efinitely helped,  Less irritated less ngry   Was having trouble flling asleep, backed off th e eve dose to afternoon, now helping Patient notes ongoing compliance with antidepressant medication. No obvious side effects. Reports does not miss a dose. Overall continues to help depression substantially. No thoughts of homicide or suicide. Would like to maintain medication.   Exercising four days pr wk two hrs er ay   taking hyfdroodone approx one tab every six hrs thrre to four times per day  Pain meds not rally causing side effects that way    No major constipation issues    Review of Systems No headache, no major weight loss or weight gain, no chest pain no back pain abdominal pain no change in bowel habits complete ROS otherwise negative     Objective:   Physical Exam Alert and oriented, vitals reviewed and stable, NAD ENT-TM's and ext canals WNL bilat via otoscopic exam Soft palate, tonsils and post pharynx WNL via oropharyngeal exam Neck-symmetric, no masses; thyroid nonpalpable and nontender Pulmonary-no tachypnea or accessory muscle use; Clear without wheezes via auscultation Card--no abnrml murmurs, rhythm reg and rate WNL Carotid pulses symmetric, without bruits Right anterior thigh multiple vesicular lesions       Assessment & Plan:  Impression 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  This is unusual at this age HIV.  Encouraged to get the blood work.  Patient would like vaccine.  With history of recurrence I think this is a good.  Will prescribe.  Likely will be rejected by insurance company we will work on a letter  2.  Pain control ongoing need for meds discussed.  Hydrocodone prescribed once again side effects benefits discussed  3.   Depression/anxiety/PTSD improved on current medication discussed to maintain same counseling soon  Follow-up as scheduled.  She works vaccines with

## 2017-10-27 NOTE — Telephone Encounter (Signed)
Mr. Cory Davis called to cancel his appointment for today.  He stated he was at another physician;s office and it was taking a long time.  I offered to let him come in later this morning or this afternoon.  He said that he couldn't do that because [place] 2[place]00 today.  I asked him to hold for just a minute so that I could look at Dr. Sanjuan DameKeeling's schedule to see when to reschedule him and after just a moment on hold, he hung up.  I couldn't reach him back on the phone so I will send a letter asking him to call the office to reschedule appointment.

## 2018-01-28 ENCOUNTER — Emergency Department (HOSPITAL_COMMUNITY)
Admission: EM | Admit: 2018-01-28 | Discharge: 2018-01-28 | Disposition: A | Discharge status: Home / Self Care | Payer: PRIVATE HEALTH INSURANCE | Attending: Emergency Medicine | Admitting: Emergency Medicine

## 2018-01-28 ENCOUNTER — Encounter (HOSPITAL_COMMUNITY): Payer: Self-pay | Admitting: Cardiology

## 2018-01-28 DIAGNOSIS — H1031 Unspecified acute conjunctivitis, right eye: Secondary | ICD-10-CM | POA: Insufficient documentation

## 2018-01-28 DIAGNOSIS — Z79899 Other long term (current) drug therapy: Secondary | ICD-10-CM | POA: Diagnosis not present

## 2018-01-28 DIAGNOSIS — F17210 Nicotine dependence, cigarettes, uncomplicated: Secondary | ICD-10-CM | POA: Insufficient documentation

## 2018-01-28 DIAGNOSIS — H5711 Ocular pain, right eye: Secondary | ICD-10-CM | POA: Diagnosis present

## 2018-01-28 DIAGNOSIS — H109 Unspecified conjunctivitis: Secondary | ICD-10-CM

## 2018-01-28 MED ORDER — KETOROLAC TROMETHAMINE 0.5 % OP SOLN
1.0000 [drp] | Freq: Once | OPHTHALMIC | Status: AC
  Administered 2018-01-28: 1 [drp] via OPHTHALMIC
  Filled 2018-01-28: qty 5

## 2018-01-28 MED ORDER — FLUORESCEIN SODIUM 1 MG OP STRP
1.0000 | ORAL_STRIP | Freq: Once | OPHTHALMIC | Status: AC
  Administered 2018-01-28: 1 via OPHTHALMIC

## 2018-01-28 MED ORDER — TETRACAINE HCL 0.5 % OP SOLN
2.0000 [drp] | Freq: Once | OPHTHALMIC | Status: AC
  Administered 2018-01-28: 2 [drp] via OPHTHALMIC
  Filled 2018-01-28: qty 4

## 2018-01-28 MED ORDER — HYDROCODONE-ACETAMINOPHEN 5-325 MG PO TABS: 1.0000 | ORAL_TABLET | ORAL | 0 refills | Status: DC | PRN

## 2018-01-28 NOTE — ED Notes (Signed)
Pt states with right eye pain from time to time, unable to see eye doctor today, pt states they usually prescribe eye drops for pain and another one

## 2018-01-28 NOTE — ED Triage Notes (Signed)
Right eye pain since 7 am.

## 2018-01-28 NOTE — Discharge Instructions (Addendum)
You may take the hydrocodone prescribed for pain relief.  This will make you drowsy - do not drive within 4 hours of taking this medication. Apply one drop of the ketorolac drop in your right eye up to 4 times daily for pain relief.  Return here over the weekend for any worsening symptoms and of course do not wear your contacts until this is cleared by your eye doctor

## 2018-01-30 NOTE — ED Provider Notes (Signed)
[facility] EMERGENCY DEPARTMENT Provider Note   CSN: [id number] Arrival date & time: 01/28/18  1225     History   Chief Complaint No chief complaint on file.   HPI Cory Davis is a 27 y.o. male who is a contact lens wearer, presenting with right eye pain, redness and clear drainage starting around 7 am.  He reports he has been having intermittent episodes of this problem with the right contact lens and his ophthalmologist (whom he is scheduled to see in 4 days) is planning to do a new contact lens fitting to find the right brand and shape for his eye.  He has a slight foreign body sensation with blinking and endorses photophobia.  He took the contacts out when he first recognized the irritation, reporting it can get a whole lot worse but he is "catching it early".  States he needs a pain eye drop and pain medicine until can see ophthalmology. He denies visual acuity changes, headache, fevers,chills or other complaint.  He has had no treatment prior to arrival.  The history is provided by the patient.    Past Medical History:  Diagnosis Date  . Chronic back pain   . Chronic knee pain   . Prediabetes   . Renal disorder   . Sciatica     Patient Active Problem List   Diagnosis Date Noted  . Gastroesophageal reflux disease without esophagitis 10/12/2017  . TEAR LATERAL MENISCUS 01/10/2008  . L C L SPRAIN 01/10/2008  . M C L SPRAIN 01/10/2008  . TEAR A C L 01/10/2008    Past Surgical History:  Procedure Laterality Date  . ANTERIOR CRUCIATE LIGAMENT REPAIR    . KNEE ARTHROPLASTY          Home Medications    Prior to Admission medications   Medication Sig Start Date End Date Taking? Authorizing Provider  acyclovir (ZOVIRAX) 800 MG tablet Take 1 tablet (800 mg total) by mouth 5 (five) times daily. 10/23/17   Burgess Amor, PA-C  buPROPion [facility] SR) 150 MG 12 hr tablet one BID 10/27/17   Merlyn Albert, MD  HYDROcodone-acetaminophen (NORCO/VICODIN) 5-325 MG  tablet Take 1 tablet by mouth every 4 (four) hours as needed. 01/28/18   Burgess Amor, PA-C  ibuprofen (ADVIL,MOTRIN) 800 MG tablet Take 1 tablet (800 mg total) 3 (three) times daily by mouth. 09/27/17   Rancour, Jeannett Senior, MD  pantoprazole (PROTONIX) 40 MG tablet Take 1 tablet (40 mg total) by mouth daily. Prn acid reflux 10/11/17   Campbell Riches, NP    Family History Family History  Problem Relation Age of Onset  . Diabetes Other   . Diabetes Mother   . Hypertension Father   . Cancer Sister   . Diabetes Maternal Grandfather   . Hypertension Maternal Grandfather     Social History Social History   Tobacco Use  . Smoking status: Current Every Day Smoker    Packs/day: 0.50    Years: 10.00    Pack years: 5.00    Types: Cigarettes  . Smokeless tobacco: Former Engineer, water Use Topics  . Alcohol use: No    Frequency: Never    Comment: occ  . Drug use: No     Allergies   Patient has no known allergies.   Review of Systems Review of Systems  Constitutional: Negative for chills and fever.  HENT: Negative for congestion, ear pain, rhinorrhea, sinus pressure, sore throat, trouble swallowing and voice change.   Eyes: Positive  for photophobia, pain and redness. Negative for discharge and visual disturbance.  Respiratory: Negative for cough, shortness of breath, wheezing and stridor.   Cardiovascular: Negative for chest pain.  Gastrointestinal: Negative for abdominal pain.  Genitourinary: Negative.      Physical Exam Updated Vital Signs BP 127/81 (BP Location: Right Arm)   Pulse 87   Temp 98.4 F (36.9 C) (Oral)   Resp 16   Ht 6\' 2"  (1.88 m)   Wt 113.4 kg (250 lb)   SpO2 99%   BMI 32.10 kg/m   Physical Exam  Constitutional: He is oriented to person, place, and time. He appears well-developed and well-nourished.  HENT:  Head: Normocephalic and atraumatic.  Right Ear: Tympanic membrane and ear canal normal.  Left Ear: Tympanic membrane and ear canal normal.    Nose: No mucosal edema or rhinorrhea.  Mouth/Throat: Uvula is midline, oropharynx is clear and moist and mucous membranes are normal. No oropharyngeal exudate, posterior oropharyngeal edema, posterior oropharyngeal erythema or tonsillar abscesses.  Eyes: Pupils are equal, round, and reactive to light. EOM are normal. Lids are everted and swept, no foreign bodies found. Right eye exhibits no discharge and no exudate. No foreign body present in the right eye. Right conjunctiva is injected. Left conjunctiva is not injected.  Fundoscopic exam:      The right eye shows no papilledema. The right eye shows no venous pulsations.  Slit lamp exam:      The right eye shows no corneal abrasion, no corneal flare, no corneal ulcer, no foreign body, no hyphema, no fluorescein uptake and no anterior chamber bulge.  Clear tearing from right eye.  Visual Acuity Bilateral Near: 20/13 (pt wearing glasses at time of screening)  R Near: 20/20  L Near: 20/13     Cardiovascular: Normal rate and normal heart sounds.  Pulmonary/Chest: Effort normal. No respiratory distress. He has no wheezes. He has no rales.  Abdominal: Soft. There is no tenderness.  Musculoskeletal: Normal range of motion.  Neurological: He is alert and oriented to person, place, and time.  Skin: Skin is warm and dry. No rash noted.  Psychiatric: He has a normal mood and affect.     ED Treatments / Results  Labs (all labs ordered are listed, but only abnormal results are displayed) Labs Reviewed - No data to display  EKG None  Radiology No results found.  Procedures Procedures (including critical care time)  Medications Ordered in ED Medications  tetracaine (PONTOCAINE) 0.5 % ophthalmic solution 2 drop (2 drops Both Eyes Given 01/28/18 1425)  fluorescein ophthalmic strip 1 strip (1 strip Both Eyes Given 01/28/18 1426)  ketorolac (ACULAR) 0.5 % ophthalmic solution 1 drop (1 drop Right Eye Given 01/28/18 1424)     Initial  Impression / Assessment and Plan / ED Course  I have reviewed the triage vital signs and the nursing notes.  Pertinent labs & imaging results that were available during my care of the patient were reviewed by me and considered in my medical decision making (see chart for details).     Pt with right acute conjunctivitis, apparently caused by contact lens use.  No visual changes, Pt with prior episodes of similar sx.  Resolution of pain with tetracaine dosing, suggesting superficial conjunctival irritation.  No abrasion indicating need for abx. . Doubt deeper globe pathology including glaucoma since sx responded to topical tetracaine. He was started on ketorolac drops, first dose here. Advised to wear his glasses, not contacts, and f/u with his  ophthalmologist in  4 days (pt cannot recall name, is in [place]). Recheck here for any worsened sx.   Final Clinical Impressions(s) / ED Diagnoses   Final diagnoses:  Conjunctivitis of right eye, unspecified conjunctivitis type    ED Discharge Orders        Ordered    HYDROcodone-acetaminophen (NORCO/VICODIN) 5-325 MG tablet  Every 4 hours PRN     01/28/18 1430       Victoriano Laindol, Izaiyah Kleinman, PA-C 01/30/18 1118    Eber HongMiller, Brian, MD 01/30/18 [phone]

## 2018-02-21 ENCOUNTER — Ambulatory Visit (INDEPENDENT_AMBULATORY_CARE_PROVIDER_SITE_OTHER): Payer: PRIVATE HEALTH INSURANCE | Admitting: Family Medicine

## 2018-02-21 ENCOUNTER — Encounter: Payer: Self-pay | Admitting: Family Medicine

## 2018-02-21 VITALS — BP 122/88 | Temp 99.1°F | Ht 74.0 in | Wt 247.0 lb

## 2018-02-21 DIAGNOSIS — R454 Irritability and anger: Secondary | ICD-10-CM | POA: Diagnosis not present

## 2018-02-21 DIAGNOSIS — F4310 Post-traumatic stress disorder, unspecified: Secondary | ICD-10-CM | POA: Diagnosis not present

## 2018-02-21 DIAGNOSIS — F411 Generalized anxiety disorder: Secondary | ICD-10-CM

## 2018-02-21 DIAGNOSIS — J329 Chronic sinusitis, unspecified: Secondary | ICD-10-CM

## 2018-02-21 MED ORDER — CEFDINIR 300 MG PO CAPS: 300.0000 mg | ORAL_CAPSULE | Freq: Two times a day (BID) | ORAL | 0 refills | Status: AC

## 2018-02-21 MED ORDER — BENZONATATE 100 MG PO CAPS: 100.0000 mg | ORAL_CAPSULE | Freq: Three times a day (TID) | ORAL | 0 refills | Status: DC | PRN

## 2018-02-21 NOTE — Progress Notes (Signed)
   Subjective:    Patient ID: Cory Davis, male    DOB: 07/06/1991, 27 y.o.   MRN: [id number] Patient presents1 4 apparent sinus infection, but has other substantial concerns HPI  Patient is here today with complaints of runny nose, sneezing,fever,body aches,headache ongoing for a few days now. He has been taking Allegra, Robitussin.  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 Went to the p d and decided to take an admin leave  Psych set up by workplace rec a long term m h support  Pt not to keen about taking meds for m h symtoms at this time  pt took our wellbutrin for a coupt three mo's. Did not notice much of a diferance, and elected to hld off on meds         Review of Systems No headache, no major weight loss or weight gain, no chest pain no back pain abdominal pain no change in bowel habits complete ROS otherwise negative     Objective:   Physical Exam Alert and oriented, vitals reviewed and stable, NAD ENT-TM's and ext canals frontal nasal congestion impression post flu rhinosinusitis discussed WNL bilat via otoscopic exam Soft palate, tonsils and post pharynx WNL via oropharyngeal exam Neck-symmetric, no masses; thyroid nonpalpable and nontender Pulmonary-no tachypnea or accessory muscle use; Clear without wheezes via auscultation Card--no abnrml murmurs, rhythm reg and rate WNL Carotid pulses symmetric,  without bruits        Assessment & Plan:  Impression post flu rhinosinusitis discussed.  Antibiotics prescribed symptom care discussed  1 PTSD with element of anxiety and depression.  She would like to take medication but also during some of her issues.  Has been placed on administrative leave.  Would prefer to get [facility] mental health [place] [facility] [place] [facility].  Has seen the employee doctor once who advised private mental health care.  We will help set this up  Greater than 50% of this 25 minute face to face visit was spent in counseling and discussion and coordination of care regarding the above diagnosis/diagnosies

## 2018-02-27 ENCOUNTER — Encounter: Payer: Self-pay | Admitting: Family Medicine

## 2018-03-02 ENCOUNTER — Telehealth: Payer: Self-pay | Admitting: Family Medicine

## 2018-03-02 NOTE — Telephone Encounter (Signed)
I called and spoke with Zack at [facility].  When [facility] called earlier, they only checked the "faxed" pile of referrals and didn't check what was in the system.  Zack said he was going to leave a note for Lupita LeashDonna to look into.

## 2018-03-02 NOTE — Telephone Encounter (Signed)
ok 

## 2018-03-02 NOTE — Telephone Encounter (Signed)
[facility] Hawkingnnie Davis is requesting a referral be refax to their office with the last two office visits and labs.They are stating cant find recent referral that was sent.Patient is needing to be seen their to get clearance to go back to work.

## 2018-03-06 ENCOUNTER — Telehealth: Payer: Self-pay | Admitting: Family Medicine

## 2018-03-06 NOTE — Telephone Encounter (Signed)
ok 

## 2018-03-06 NOTE — Telephone Encounter (Signed)
Patient advised per Dr Brett Canales:  Unfortunately there is a shortage of qualified mental health professionals at this time which is beyond my control. There are emergency services available but only for those who are acutely of danger to themselves or others. We can try to get an earlier appt for [name]. Patient verbalized understanding and stated that he is not homicidal or suicidal -patient states he is ready to return to work and did't want to have to wait another month for his release.

## 2018-03-06 NOTE — Telephone Encounter (Signed)
Ntsw, pt had m h issues which required him to go on administrative leave at [employer], unfortunately there is a shortage of qualified m h professionals at this time which is beyond my control. There are emergency services available but only for those who are acutely of danger to themselves or others. We can try to get an earlier appt for [name]

## 2018-03-06 NOTE — Telephone Encounter (Signed)
[facility] at [facility]

## 2018-03-06 NOTE — Telephone Encounter (Signed)
Please forward to Dr. Brett Canales to keep him informed

## 2018-03-06 NOTE — Telephone Encounter (Signed)
Cory Davis with [facility] called back, explained we needed to get pt seen sooner, she states that they do not have a sooner appointment available, she did state that this patient is on their cancel list and if something opens up he will be called to come in sooner  Called & explained to pt, pt verbalized understanding

## 2018-03-06 NOTE — Telephone Encounter (Signed)
Patient would like to speak with doctor directly about his behavioral health appointment at the end of May he wants get get a sooner appointment so he will be able to go back work before the end of May.He has to have clearance before he can go back to work.
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
## 2018-03-09 ENCOUNTER — Ambulatory Visit (INDEPENDENT_AMBULATORY_CARE_PROVIDER_SITE_OTHER): Payer: PRIVATE HEALTH INSURANCE | Admitting: Psychiatry

## 2018-03-09 ENCOUNTER — Encounter (HOSPITAL_COMMUNITY): Payer: Self-pay | Admitting: Psychiatry

## 2018-03-09 ENCOUNTER — Encounter (INDEPENDENT_AMBULATORY_CARE_PROVIDER_SITE_OTHER): Payer: Self-pay

## 2018-03-09 VITALS — BP 126/79 | HR 70 | Ht 74.0 in | Wt 251.0 lb

## 2018-03-09 DIAGNOSIS — F331 Major depressive disorder, recurrent, moderate: Secondary | ICD-10-CM

## 2018-03-09 DIAGNOSIS — F4310 Post-traumatic stress disorder, unspecified: Secondary | ICD-10-CM | POA: Diagnosis not present

## 2018-03-09 DIAGNOSIS — G4700 Insomnia, unspecified: Secondary | ICD-10-CM

## 2018-03-09 DIAGNOSIS — Z5689 Other problems related to employment: Secondary | ICD-10-CM

## 2018-03-09 DIAGNOSIS — F17210 Nicotine dependence, cigarettes, uncomplicated: Secondary | ICD-10-CM | POA: Diagnosis not present

## 2018-03-09 MED ORDER — SERTRALINE HCL 50 MG PO TABS: ORAL_TABLET | ORAL | 0 refills | Status: DC

## 2018-03-09 NOTE — Patient Instructions (Signed)
1. Start sertraline 25 mg daily for one week, then 50 mg daily  2. Referral to therapy  3. 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 Return to clinic in one month for 30 mins 5. Obtain record from Princella Ion, psychologist

## 2018-03-17 ENCOUNTER — Telehealth: Payer: Self-pay | Admitting: Family Medicine

## 2018-03-17 NOTE — Telephone Encounter (Signed)
Pt returned call;verbalized understanding.  

## 2018-03-17 NOTE — Telephone Encounter (Signed)
Left message to return call 

## 2018-03-17 NOTE — Telephone Encounter (Signed)
Pt states that he is having kidney stones. He passed one last night. States he is having right sided pain. States this has happened multiple times before. Advised pt that we were booked up today and if felt the need he could go to ER or Urgent Care; informed pt that I would send provider a message and pt stated that he would wait to hear back from [place]

## 2018-03-17 NOTE — Telephone Encounter (Signed)
If he is having ongoing pain and discomfort of significant nature I would recommend consultation in the ER because they are able to run a scan whereas urgent care would not be able to do so

## 2018-03-28 ENCOUNTER — Encounter (HOSPITAL_COMMUNITY): Payer: Self-pay | Admitting: Psychiatry

## 2018-03-28 ENCOUNTER — Telehealth (HOSPITAL_COMMUNITY): Payer: Self-pay | Admitting: Psychiatry

## 2018-03-28 ENCOUNTER — Ambulatory Visit (INDEPENDENT_AMBULATORY_CARE_PROVIDER_SITE_OTHER): Payer: PRIVATE HEALTH INSURANCE | Admitting: Psychiatry

## 2018-03-28 VITALS — BP 137/86 | HR 105 | Ht 74.0 in | Wt 250.0 lb

## 2018-03-28 DIAGNOSIS — F4310 Post-traumatic stress disorder, unspecified: Secondary | ICD-10-CM | POA: Diagnosis not present

## 2018-03-28 DIAGNOSIS — Z915 Personal history of self-harm: Secondary | ICD-10-CM | POA: Diagnosis not present

## 2018-03-28 DIAGNOSIS — F331 Major depressive disorder, recurrent, moderate: Secondary | ICD-10-CM

## 2018-03-28 NOTE — Telephone Encounter (Signed)
Left voice message to [facility], Ms. Marchelle Folks [phone] Ext [phone] to contact the office to discuss the case.

## 2018-03-28 NOTE — Patient Instructions (Signed)
1. Continue sertraline 50 mg daily 2. Keep the appointment in June 3. Obtain record from Princella Ion, psychologist 4. Consent form to discuss with your grandparents
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
## 2018-03-29 LAB — TSH: TSH: 0.42 m[IU]/L (ref 0.40–4.50)

## 2018-03-31 ENCOUNTER — Ambulatory Visit (HOSPITAL_COMMUNITY): Payer: PRIVATE HEALTH INSURANCE | Admitting: Psychiatry
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
## 2018-04-11 ENCOUNTER — Encounter (HOSPITAL_COMMUNITY): Payer: Self-pay | Admitting: Psychiatry

## 2018-04-11 ENCOUNTER — Ambulatory Visit (INDEPENDENT_AMBULATORY_CARE_PROVIDER_SITE_OTHER): Payer: PRIVATE HEALTH INSURANCE | Admitting: Psychiatry

## 2018-04-11 VITALS — BP 144/86 | HR 101 | Ht 74.0 in | Wt 244.0 lb

## 2018-04-11 DIAGNOSIS — F4310 Post-traumatic stress disorder, unspecified: Secondary | ICD-10-CM

## 2018-04-11 DIAGNOSIS — F17210 Nicotine dependence, cigarettes, uncomplicated: Secondary | ICD-10-CM

## 2018-04-11 DIAGNOSIS — F331 Major depressive disorder, recurrent, moderate: Secondary | ICD-10-CM | POA: Diagnosis not present

## 2018-04-11 DIAGNOSIS — Z5689 Other problems related to employment: Secondary | ICD-10-CM | POA: Diagnosis not present

## 2018-04-11 DIAGNOSIS — Z915 Personal history of self-harm: Secondary | ICD-10-CM | POA: Diagnosis not present

## 2018-04-11 MED ORDER — SERTRALINE HCL 50 MG PO TABS: 50.0000 mg | ORAL_TABLET | Freq: Every day | ORAL | 0 refills | Status: DC

## 2018-04-11 NOTE — Patient Instructions (Signed)
1. Continue sertraline 50 mg daily  2. Return to clinic in one month for 30 mins 3. Will write a letter to return to work in 04/12/2018

## 2018-05-05 NOTE — Progress Notes (Deleted)
BH MD/PA/NP OP Progress Note  05/05/2018 11:33 AM Stark Braynthony J Castronovo  MRN:  [id number]  Chief Complaint:  HPI: *** Visit Diagnosis: No diagnosis found.  Past Psychiatric History: Please see initial evaluation for full details. I have reviewed the history. No updates at this time.     Past Medical History:  Past Medical History:  Diagnosis Date  . Chronic back pain   . Chronic knee pain   . Prediabetes   . Renal disorder   . Sciatica     Past Surgical History:  Procedure Laterality Date  . ANTERIOR CRUCIATE LIGAMENT REPAIR    . KNEE ARTHROPLASTY      Family Psychiatric History: Please see initial evaluation for full details. I have reviewed the history. No updates at this time.     Family History:  Family History  Problem Relation Age of Onset  . Diabetes Other   . Diabetes Mother   . Hypertension Father   . Cancer Sister   . Diabetes Maternal Grandfather   . Hypertension Maternal Grandfather     Social History:  Social History   Socioeconomic History  . Marital status: Single    Spouse name: Not on file  . Number of children: Not on file  . Years of education: Not on file  . Highest education level: Not on file  Occupational History  . Not on file  Social Needs  . Financial resource strain: Not on file  . Food insecurity:    Worry: Not on file    Inability: Not on file  . Transportation needs:    Medical: Not on file    Non-medical: Not on file  Tobacco Use  . Smoking status: Current Every Day Smoker    Packs/day: 0.50    Years: 10.00    Pack years: 5.00    Types: Cigarettes  . Smokeless tobacco: Former Engineer, waterUser  Substance and Sexual Activity  . Alcohol use: No    Frequency: Never    Comment: occ  . Drug use: No  . Sexual activity: Not on file  Lifestyle  . Physical activity:    Days per week: Not on file    Minutes per session: Not on file  . Stress: Not on file  Relationships  . Social connections:    Talks on phone: Not on file    Gets  together: Not on file    Attends religious service: Not on file    Active member of club or organization: Not on file    Attends meetings of clubs or organizations: Not on file    Relationship status: Not on file  Other Topics Concern  . Not on file  Social History Narrative  . Not on file    Allergies: No Known Allergies  Metabolic Disorder Labs: No results found for: HGBA1C, MPG No results found for: PROLACTIN No results found for: CHOL, TRIG, HDL, CHOLHDL, VLDL, LDLCALC Lab Results  Component Value Date   TSH 0.42 03/28/2018    Therapeutic Level Labs: No results found for: LITHIUM No results found for: VALPROATE No components found for:  CBMZ  Current Medications: Current Outpatient Medications  Medication Sig Dispense Refill  . Amino Acids (BCAA IJ) Inject as directed.    Marland Kitchen. ibuprofen (ADVIL,MOTRIN) 800 MG tablet Take 1 tablet (800 mg total) 3 (three) times daily by mouth. 21 tablet 0  . Multiple Vitamin (MULTIVITAMIN WITH MINERALS) TABS tablet Take 1 tablet by mouth daily.    . pantoprazole (PROTONIX) 40 MG  tablet Take 1 tablet (40 mg total) by mouth daily. Prn acid reflux 30 tablet 2  . POTASSIUM BICARBONATE PO Take by mouth.    . sertraline (ZOLOFT) 50 MG tablet Take 1 tablet (50 mg total) by mouth daily. 30 tablet 0   No current facility-administered medications for this visit.      Musculoskeletal: Strength & Muscle Tone: within normal limits Gait & Station: normal Patient leans: N/A  Psychiatric Specialty Exam: ROS  There were no vitals taken for this visit.There is no height or weight on file to calculate BMI.  General Appearance: Fairly Groomed  Eye Contact:  Good  Speech:  Clear and Coherent  Volume:  Normal  Mood:  {BHH MOOD:22306}  Affect:  {Affect (PAA):22687}  Thought Process:  Coherent  Orientation:  Full (Time, Place, and Person)  Thought Content: Logical   Suicidal Thoughts:  {ST/HT (PAA):22692}  Homicidal Thoughts:  {ST/HT (PAA):22692}   Memory:  Immediate;   Good  Judgement:  {Judgement (PAA):22694}  Insight:  {Insight (PAA):22695}  Psychomotor Activity:  Normal  Concentration:  Concentration: Good and Attention Span: Good  Recall:  Good  Fund of Knowledge: Good  Language: Good  Akathisia:  No  Handed:  Right  AIMS (if indicated): not done  Assets:  Communication Skills Desire for Improvement  ADL's:  Intact  Cognition: WNL  Sleep:  {BHH GOOD/FAIR/POOR:22877}   Screenings: GAD-7     Office Visit from 09/23/2017 in [facility]  Total GAD-7 Score  6    PHQ2-9     Office Visit from 09/23/2017 in [facility]  PHQ-2 Total Score  0  PHQ-9 Total Score  5       Assessment and Plan:  BORIS ENGELMANN is a 27 y.o. year old male with a history of PTSD, who presents for follow up appointment for No diagnosis found.  # PTSD # MDD, moderate, recurrent without psychotic features  There has been steady improvement in neurovegetative and PTSD symptoms since the last appointment. Exam is also notable that he is more open to the interview without downplaying his symptoms. Will continue sertraline to target PTSD and depression. He is willing to return to clinic and discuss if any medication adjustment is needed. He will continue to see his therapist. 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  Noted that this note Clinical research associate obtained collateral from his grandmother at home; she denies any safety concern or concern of his behavior.   Plan  1. Continue sertraline 50 mg daily  2. Return to clinic in one month for 30 mins 3. Will write a letter to return to work in 04/12/2018; he will be seen at [facility] this afternoon. 4. Reviewed TSH; wnl  .I have reviewed suicide assessment in detail. No change in the following assessment.   The patient demonstrates the following risk factors for suicide: Chronic risk  factors for suicide include:psychiatric disorder ofdepression, PTSD. Acute risk factorsfor suicide include: N/A. Protective factorsfor this patient include: positive social support, responsibility to others (children, family), coping skills and hope for the future. Considering these factors, the overall suicide risk at this point appears to below. Patientisappropriate for outpatient follow up. He does have access to guns and declines to take away his guns.He denies SI, HI.    Neysa Hotter, MD 05/05/2018, 11:33 AM

## 2018-05-12 ENCOUNTER — Ambulatory Visit (HOSPITAL_COMMUNITY): Payer: PRIVATE HEALTH INSURANCE | Admitting: Psychiatry

## 2018-07-28 ENCOUNTER — Ambulatory Visit: Payer: PRIVATE HEALTH INSURANCE | Admitting: Family Medicine

## 2018-08-03 ENCOUNTER — Encounter: Payer: Self-pay | Admitting: Family Medicine

## 2018-08-07 ENCOUNTER — Ambulatory Visit: Payer: PRIVATE HEALTH INSURANCE | Admitting: Family Medicine

## 2018-08-07 DIAGNOSIS — Z029 Encounter for administrative examinations, unspecified: Secondary | ICD-10-CM

## 2018-08-17 ENCOUNTER — Encounter: Payer: Self-pay | Admitting: Family Medicine

## 2018-10-02 ENCOUNTER — Emergency Department (HOSPITAL_COMMUNITY)
Admission: EM | Admit: 2018-10-02 | Discharge: 2018-10-02 | Disposition: A | Discharge status: Home / Self Care | Payer: Worker's Compensation | Attending: Emergency Medicine | Admitting: Emergency Medicine

## 2018-10-02 ENCOUNTER — Other Ambulatory Visit: Payer: Self-pay

## 2018-10-02 ENCOUNTER — Encounter (HOSPITAL_COMMUNITY): Payer: Self-pay | Admitting: Emergency Medicine

## 2018-10-02 DIAGNOSIS — Y990 Civilian activity done for income or pay: Secondary | ICD-10-CM | POA: Diagnosis not present

## 2018-10-02 DIAGNOSIS — F17210 Nicotine dependence, cigarettes, uncomplicated: Secondary | ICD-10-CM | POA: Diagnosis not present

## 2018-10-02 DIAGNOSIS — Y929 Unspecified place or not applicable: Secondary | ICD-10-CM | POA: Diagnosis not present

## 2018-10-02 DIAGNOSIS — T24112A Burn of first degree of left thigh, initial encounter: Secondary | ICD-10-CM | POA: Insufficient documentation

## 2018-10-02 DIAGNOSIS — Y33XXXA Other specified events, undetermined intent, initial encounter: Secondary | ICD-10-CM | POA: Insufficient documentation

## 2018-10-02 DIAGNOSIS — Y939 Activity, unspecified: Secondary | ICD-10-CM | POA: Insufficient documentation

## 2018-10-02 DIAGNOSIS — T24012A Burn of unspecified degree of left thigh, initial encounter: Secondary | ICD-10-CM | POA: Diagnosis present

## 2018-10-02 DIAGNOSIS — T304 Corrosion of unspecified body region, unspecified degree: Secondary | ICD-10-CM

## 2018-10-02 DIAGNOSIS — Z79899 Other long term (current) drug therapy: Secondary | ICD-10-CM | POA: Diagnosis not present

## 2018-10-02 MED ORDER — IBUPROFEN 800 MG PO TABS: 800.0000 mg | ORAL_TABLET | Freq: Three times a day (TID) | ORAL | 0 refills | Status: DC

## 2018-10-02 MED ORDER — SILVER SULFADIAZINE 1 % EX CREA
TOPICAL_CREAM | Freq: Once | CUTANEOUS | Status: AC
  Administered 2018-10-02: 19:00:00 via TOPICAL
  Filled 2018-10-02: qty 50

## 2018-10-02 MED ORDER — IBUPROFEN 800 MG PO TABS
800.0000 mg | ORAL_TABLET | Freq: Once | ORAL | Status: AC
  Administered 2018-10-02: 800 mg via ORAL
  Filled 2018-10-02: qty 1

## 2018-10-02 MED ORDER — HYDROCODONE-ACETAMINOPHEN 5-325 MG PO TABS: 1.0000 | ORAL_TABLET | ORAL | 0 refills | Status: DC | PRN

## 2018-10-02 NOTE — Discharge Instructions (Addendum)
Apply a small layer of the Silvadene given to you twice daily after gently washing the old away using mild soap and water.  Keep the wound covered while using this medication.  You may use the ibuprofen as discussed for daytime use of pain management.  Use caution with this medication, if you start having stomach upset or acid reflux symptoms then you should discontinue this medication.  You may use the hydrocodone when not working or driving for additional pain relief.  Plan to get rechecked for any new or worsening symptoms.  Hopefully today's treatment plan will completely resolve this skin injury.
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
## 2018-10-02 NOTE — ED Notes (Signed)
Consulting civil engineerCharge RN notified. Decon shower prepared.

## 2018-10-20 ENCOUNTER — Ambulatory Visit: Payer: PRIVATE HEALTH INSURANCE | Admitting: Family Medicine

## 2018-10-27 ENCOUNTER — Ambulatory Visit (INDEPENDENT_AMBULATORY_CARE_PROVIDER_SITE_OTHER): Payer: PRIVATE HEALTH INSURANCE | Admitting: Family Medicine

## 2018-10-27 ENCOUNTER — Encounter: Payer: Self-pay | Admitting: Family Medicine

## 2018-10-27 VITALS — BP 124/76 | Ht 72.75 in | Wt 256.0 lb

## 2018-10-27 DIAGNOSIS — Z13220 Encounter for screening for lipoid disorders: Secondary | ICD-10-CM

## 2018-10-27 DIAGNOSIS — Z0000 Encounter for general adult medical examination without abnormal findings: Secondary | ICD-10-CM

## 2018-10-27 MED ORDER — AMOXICILLIN-POT CLAVULANATE 875-125 MG PO TABS: 1.0000 | ORAL_TABLET | Freq: Two times a day (BID) | ORAL | 0 refills | Status: DC

## 2018-10-27 NOTE — Progress Notes (Signed)
   Subjective:    Patient ID: Cory Davis, male    DOB: Sep 23, 1991, 27 y.o.   MRN: [id number]  HPI The patient comes in today for a wellness visit.    A review of their health history was completed.  A review of medications was also completed.  Any needed refills; not taking any meds  Eating habits: health conscious  Falls/  MVA accidents in past few months: none  Regular exercise: 5 times per week  Specialist pt sees on regular basis: none  Preventative health issues were discussed.   Additional concerns: none   Cough and cong and dranage   Gunky discharge   Stated with fever and cachineess   Cough has not gone away    Working night,  pso   Cold and cong  One half pack per day   Using rob and dayquil vapor oool   Stress level ovrall has calmed down     Much bettere     Review of Systems  Constitutional: Negative for activity change, appetite change and fever.  HENT: Negative for congestion and rhinorrhea.   Eyes: Negative for discharge.  Respiratory: Negative for cough and wheezing.   Cardiovascular: Negative for chest pain.  Gastrointestinal: Negative for abdominal pain, blood in stool and vomiting.  Genitourinary: Negative for difficulty urinating and frequency.  Musculoskeletal: Negative for neck pain.  Skin: Negative for rash.  Allergic/Immunologic: Negative for environmental allergies and food allergies.  Neurological: Negative for weakness and headaches.  Psychiatric/Behavioral: Negative for agitation.  All other systems reviewed and are negative.      Objective:   Physical Exam Vitals signs reviewed.  Constitutional:      Appearance: He is well-developed.  HENT:     Head: Normocephalic and atraumatic.     Comments: Sub stantial nasal cong and gunkiness and discharge    Right Ear: External ear normal.     Left Ear: External ear normal.     Nose: Nose normal.  Eyes:     Pupils: Pupils are equal, round, and reactive to  light.  Neck:     Musculoskeletal: Normal range of motion and neck supple.     Thyroid: No thyromegaly.  Cardiovascular:     Rate and Rhythm: Normal rate and regular rhythm.     Heart sounds: Normal heart sounds. No murmur.  Pulmonary:     Effort: Pulmonary effort is normal. No respiratory distress.     Breath sounds: Normal breath sounds. No wheezing.  Abdominal:     General: Bowel sounds are normal. There is no distension.     Palpations: Abdomen is soft. There is no mass.     Tenderness: There is no abdominal tenderness.  Genitourinary:    Penis: Normal.   Musculoskeletal: Normal range of motion.  Lymphadenopathy:     Cervical: No cervical adenopathy.  Skin:    General: Skin is warm and dry.     Findings: No erythema.  Neurological:     Mental Status: He is alert.     Motor: No abnormal muscle tone.  Psychiatric:        Behavior: Behavior normal.        Judgment: Judgment normal.           Assessment & Plan:  Wellness exam. Diet disc, exercise disc, vaccines disc, overall pt states mental health significantly better thn last yr, disc. Abx prescribed for rhinosinusitis. Pos blood work ordered, [facility] [employer]

## 2018-12-31 ENCOUNTER — Emergency Department (HOSPITAL_COMMUNITY): Payer: Worker's Compensation

## 2018-12-31 ENCOUNTER — Encounter (HOSPITAL_COMMUNITY): Payer: Self-pay | Admitting: Emergency Medicine

## 2018-12-31 ENCOUNTER — Other Ambulatory Visit: Payer: Self-pay

## 2018-12-31 ENCOUNTER — Emergency Department (HOSPITAL_COMMUNITY)
Admission: EM | Admit: 2018-12-31 | Discharge: 2018-12-31 | Disposition: A | Discharge status: Home / Self Care | Payer: Worker's Compensation | Attending: Emergency Medicine | Admitting: Emergency Medicine

## 2018-12-31 DIAGNOSIS — F17210 Nicotine dependence, cigarettes, uncomplicated: Secondary | ICD-10-CM | POA: Insufficient documentation

## 2018-12-31 DIAGNOSIS — M25561 Pain in right knee: Secondary | ICD-10-CM | POA: Diagnosis present

## 2018-12-31 DIAGNOSIS — Z96659 Presence of unspecified artificial knee joint: Secondary | ICD-10-CM | POA: Insufficient documentation

## 2018-12-31 DIAGNOSIS — Z79899 Other long term (current) drug therapy: Secondary | ICD-10-CM | POA: Diagnosis not present

## 2018-12-31 MED ORDER — OXYCODONE-ACETAMINOPHEN 5-325 MG PO TABS
1.0000 | ORAL_TABLET | Freq: Once | ORAL | Status: AC
  Administered 2018-12-31: 1 via ORAL
  Filled 2018-12-31: qty 1

## 2018-12-31 MED ORDER — IBUPROFEN 800 MG PO TABS: 800.0000 mg | ORAL_TABLET | Freq: Three times a day (TID) | ORAL | 0 refills | Status: DC

## 2018-12-31 MED ORDER — OXYCODONE-ACETAMINOPHEN 5-325 MG PO TABS: 1.0000 | ORAL_TABLET | ORAL | 0 refills | Status: DC | PRN

## 2018-12-31 MED ORDER — IBUPROFEN 800 MG PO TABS
800.0000 mg | ORAL_TABLET | Freq: Once | ORAL | Status: AC
  Administered 2018-12-31: 800 mg via ORAL
  Filled 2018-12-31: qty 1
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
## 2018-12-31 NOTE — ED Notes (Signed)
To Rad 

## 2018-12-31 NOTE — Discharge Instructions (Addendum)
Apply ice packs on and off to your knee.  Use your crutches for weightbearing.  Call Dr. Mort Sawyers office to arrange a follow-up appointment.

## 2019-01-03 ENCOUNTER — Ambulatory Visit (INDEPENDENT_AMBULATORY_CARE_PROVIDER_SITE_OTHER): Payer: Worker's Compensation | Admitting: Orthopaedic Surgery

## 2019-01-03 ENCOUNTER — Encounter: Payer: Self-pay | Admitting: Orthopaedic Surgery

## 2019-01-03 VITALS — BP 122/80 | HR 99 | Ht 74.0 in | Wt 260.0 lb

## 2019-01-03 DIAGNOSIS — M25561 Pain in right knee: Secondary | ICD-10-CM

## 2019-01-03 MED ORDER — OXYCODONE-ACETAMINOPHEN 5-325 MG PO TABS: 1.0000 | ORAL_TABLET | ORAL | 0 refills | Status: DC | PRN

## 2019-01-03 NOTE — Patient Instructions (Addendum)
..  Your MRI has been ordered.  We will contact your insurance company for approval. After the authorization is received the MRI and a return appointment will be scheduled with you by phone.  If you do not hear from [place] within 5 business days please call [phone] and ask for the pre-authorization representative in our office.  Rushie Chestnut, nurse case manager will get the MRI approved and scheduled. [phone]
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
## 2019-01-09 ENCOUNTER — Telehealth: Payer: Self-pay | Admitting: Orthopaedic Surgery

## 2019-01-09 NOTE — Telephone Encounter (Signed)
We had a message left on our voicemail from 6:00 last night.  John from [facility] called and stated that their closest provider to the patient was too far away from where the patient lives and so we need to schedule him somewhere that is closer.  He said that he has emailed the adjustor regarding this.  If you have any questions or problems, please call Jonny Ruiz at [facility] [phone] ext. [phone]

## 2019-01-09 NOTE — Telephone Encounter (Signed)
Called and spoke with patient to see what he knew. Pt states he already has MRI scheduled at [facility] and follow up with [place] the following Tuesday.

## 2019-01-11 ENCOUNTER — Ambulatory Visit (HOSPITAL_COMMUNITY)
Admission: RE | Admit: 2019-01-11 | Discharge: 2019-01-11 | Disposition: A | Discharge status: Home / Self Care | Payer: Worker's Compensation | Source: Ambulatory Visit | Attending: Orthopaedic Surgery | Admitting: Orthopaedic Surgery

## 2019-01-11 DIAGNOSIS — M25561 Pain in right knee: Secondary | ICD-10-CM

## 2019-01-16 ENCOUNTER — Ambulatory Visit (INDEPENDENT_AMBULATORY_CARE_PROVIDER_SITE_OTHER): Payer: Worker's Compensation | Admitting: Orthopaedic Surgery

## 2019-01-16 ENCOUNTER — Encounter: Payer: Self-pay | Admitting: Orthopaedic Surgery

## 2019-01-16 VITALS — BP 139/88 | HR 91 | Ht 74.0 in

## 2019-01-16 DIAGNOSIS — S83511A Sprain of anterior cruciate ligament of right knee, initial encounter: Secondary | ICD-10-CM

## 2019-01-16 DIAGNOSIS — M25561 Pain in right knee: Secondary | ICD-10-CM

## 2019-01-16 MED ORDER — IBUPROFEN 800 MG PO TABS: 800.0000 mg | ORAL_TABLET | Freq: Three times a day (TID) | ORAL | 3 refills | Status: DC

## 2019-01-16 MED ORDER — OXYCODONE-ACETAMINOPHEN 5-325 MG PO TABS: 1.0000 | ORAL_TABLET | ORAL | 0 refills | Status: DC | PRN

## 2019-01-16 NOTE — Progress Notes (Signed)
Patient [name], male DOB:1990/12/05, 28 y.o. VVK:[id number]  Chief Complaint  Patient presents with  . Knee Injury    HPI  Cory Davis is a 28 y.o. 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  He had a MRI of the right knee showing: IMPRESSION: 1. Complete ACL graft tear with associated significant pivot-shift type bone contusions and focal impaction type subchondral fracture involving the lateral femoral condyle. 2. The PCL and collateral ligaments are intact. 3. Chronic debridement type changes involving the posterior horn of the lateral meniscus. 4. Moderate-sized joint effusion.  I have informed him, his wife and his Workers Compensation representative Cory Davis of the findings.  He will need surgery on the knee.  I will have him see Dr. Romeo Davis who did the original repair.  He will remain out of work.  He is to continue the knee immobilizer.  I will refill his pain medicine and Ibuprofen.   Body mass index is 33.38 kg/m.  ROS  Review of Systems  All other systems reviewed and are negative.  The following is a summary of the past history medically, past history surgically, known current medicines, social history and family history.  This information is gathered electronically by the computer from prior information and documentation.  I review this each visit and have found including this information at this point in the chart is beneficial and informative.    Past Medical History:  Diagnosis Date  . Chronic back pain   . Chronic knee pain   . Prediabetes   . Renal disorder   . Sciatica     Past Surgical History:  Procedure Laterality Date  . ANTERIOR CRUCIATE LIGAMENT REPAIR    . KNEE ARTHROPLASTY      Family History  Problem Relation Age of Onset  . Diabetes Other   . Diabetes Mother   . Hypertension Father   . Cancer Sister   . Diabetes Maternal Grandfather   . Hypertension Maternal Grandfather      Social History Social History   Tobacco Use  . Smoking status: Current Every Day Smoker    Packs/day: 0.50    Years: 10.00    Pack years: 5.00    Types: Cigarettes  . Smokeless tobacco: Former Engineer, water Use Topics  . Alcohol use: No    Frequency: Never    Comment: occ  . Drug use: No    No Known Allergies  Current Outpatient Medications  Medication Sig Dispense Refill  . Amino Acids (BCAA IJ) Inject as directed.    Marland Kitchen amoxicillin-clavulanate (AUGMENTIN) 875-125 MG tablet Take 1 tablet by mouth 2 (two) times daily. 20 tablet 0  . ibuprofen (ADVIL,MOTRIN) 800 MG tablet Take 1 tablet (800 mg total) by mouth 3 (three) times daily. 60 tablet 3  . Multiple Vitamin (MULTIVITAMIN WITH MINERALS) TABS tablet Take 1 tablet by mouth daily.    Marland Kitchen oxyCODONE-acetaminophen (PERCOCET/ROXICET) 5-325 MG tablet Take 1 tablet by mouth every 4 (four) hours as needed. 30 tablet 0  . pantoprazole (PROTONIX) 40 MG tablet Take 1 tablet (40 mg total) by mouth daily. Prn acid reflux (Patient not taking: Reported on 10/27/2018) 30 tablet 2  . POTASSIUM BICARBONATE PO Take by mouth.    . sertraline (ZOLOFT) 50 MG tablet Take 1 tablet (50 mg total) by mouth daily. (Patient not taking: Reported on 10/27/2018) 30 tablet 0   No current facility-administered medications for this visit.  Physical Exam  Blood pressure 139/88, pulse 91, height 6\' 2"  (1.88 m).  Constitutional: overall normal hygiene, normal nutrition, well developed, normal grooming, normal body habitus. Assistive device:knee immobilzer right  Musculoskeletal: gait and station Limp right, muscle tone and strength are normal, no tremors or atrophy is present.  .  Neurological: coordination overall normal.  Deep tendon reflex/nerve stretch intact.  Sensation normal.  Cranial nerves II-XII intact.   Skin:   Normal overall no scars except right knee, lesions, ulcers or rashes. No psoriasis.  Psychiatric: Alert and oriented x 3.   Recent memory intact, remote memory unclear.  Normal mood and affect. Well groomed.  Good eye contact.  Cardiovascular: overall no swelling, no varicosities, no edema bilaterally, normal temperatures of the legs and arms, no clubbing, cyanosis and good capillary refill.  Lymphatic: palpation is normal.  Right knee is tender, has effusion, well healed scar, in pain, limited motion, limp right.  NV intact.  I did not do Lachman or pivot tests or drawer as he is in pain and I know the ACL is torn.  All other systems reviewed and are negative   The patient has been educated about the nature of the problem(s) and counseled on treatment options.  The patient appeared to understand what I have discussed and is in agreement with it.  Encounter Diagnoses  Name Primary?  . Rupture of anterior cruciate ligament of right knee, initial encounter Yes  . Acute pain of right knee     PLAN Call if any problems.  Precautions discussed.  Continue current medications.   Return to clinic to see Dr. Romeo Davis   Electronically Signed Darreld Mclean, MD 3/10/20208:55 AM

## 2019-01-16 NOTE — Patient Instructions (Signed)
Out of work 

## 2019-01-17 ENCOUNTER — Encounter: Payer: Self-pay | Admitting: Orthopedic Surgery

## 2019-01-17 ENCOUNTER — Ambulatory Visit (INDEPENDENT_AMBULATORY_CARE_PROVIDER_SITE_OTHER): Payer: Worker's Compensation | Admitting: Orthopedic Surgery

## 2019-01-17 ENCOUNTER — Telehealth: Payer: Self-pay | Admitting: Orthopedic Surgery

## 2019-01-17 ENCOUNTER — Other Ambulatory Visit: Payer: Self-pay

## 2019-01-17 VITALS — BP 114/74 | HR 99 | Ht 74.0 in | Wt 260.0 lb

## 2019-01-17 DIAGNOSIS — S83511D Sprain of anterior cruciate ligament of right knee, subsequent encounter: Secondary | ICD-10-CM | POA: Diagnosis not present

## 2019-01-17 NOTE — Addendum Note (Signed)
Addended byCaffie Damme on: 01/17/2019 09:10 AM   Modules accepted: Orders, SmartSet

## 2019-01-17 NOTE — Patient Instructions (Addendum)
OUT OF WORK 6 MONTHS   Anterior Cruciate Ligament Reconstruction A ligament is a strong, cord-like band of tissue that connects one bone to another bone. The anterior cruciate ligament (ACL) connects one of your lower leg bones to your upper leg bone. ACL reconstruction is a surgery to replace a torn ACL. During ACL reconstruction, your torn ACL is replaced with tissue from a ligament in another part of your body. Tell a health care provider about:  Any allergies you have.  All medicines you are taking, including vitamins, herbs, eye drops, creams, and over-the-counter medicines.  Any problems you or family members have had with anesthetic medicines.  Any blood disorders you have.  Any surgeries you have had.  Any medical conditions you have.  Any history of tobacco use. What are the risks? Generally, this is a safe procedure. However, problems may occur, including:  Infection.  Bleeding.  Allergic reactions to medicines.  Damage to other structures or organs.  Failure of the procedure.  Knee stiffness. What happens before the procedure?  Follow instructions from your health care provider about eating or drinking restrictions.  Ask your health care provider about: ? Changing or stopping your regular medicines. This is especially important if you are taking diabetes medicines or blood thinners. ? Taking medicines such as aspirin and ibuprofen. These medicines can thin your blood. Do not take these medicines before your procedure if your health care provider instructs you not to.  You may be given antibiotic medicine to help prevent infection.  Ask your health care provider how your surgical site will be marked or identified.  Plan to have someone take you home after the procedure. What happens during the procedure?   To reduce your risk of infection: ? Your health care team will wash or sanitize their hands. ? Your skin will be washed with soap. ? Hair may be  removed from the surgical area.  You will be given one or more of the following: ? A medicine to help you relax (sedative). ? A medicine to numb the area (local anesthetic). ? A medicine to make you fall asleep (general anesthetic). ? A medicine that is injected into your spine to numb the area below and slightly above the injection site (spinal anesthetic). ? A medicine that is injected into an area of your body to numb everything below the injection site (regional anesthetic).  A small cut (incision) will be made in your knee.  A thin, flexible, tube-like instrument with a camera on the end (arthroscope) will be inserted into your knee joint.  Your torn ACL will be removed.  Tunnels in your upper tibia and femur will be created with large drills over guide wires or guide pins.  The graft will be pulled into the tunnels.  The graft will be inserted into your knee joint.  The graft will be secured in place in the tunnels with screws.  The incision will be closed with stitches (sutures), skin glue, or skin tape (adhesive) strips.  The incision may be covered with a bandage (dressing). The procedure may vary among health care providers and hospitals. What happens after the procedure?  Your blood pressure, heart rate, breathing rate, and blood oxygen level will be monitored often until the medicines you were given have worn off.  Do not drive for 24 hours if you received a sedative. This information is not intended to replace advice given to you by your health care provider. Make sure you discuss any questions  you have with your health care provider. Document Released: 08/24/2004 Document Revised: 04/11/2017 Document Reviewed: 07/20/2015 Elsevier Interactive Patient Education  2019 [employer].

## 2019-01-17 NOTE — Telephone Encounter (Signed)
Has patient's worker's comp nurse case manager received CPT code for surgery scheduled 01/30/19 in order to pre-authorize?
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
## 2019-01-18 NOTE — Telephone Encounter (Signed)
Is that what she needs? Was not aware, I emailed her today to see what she needed, but she has not responded.

## 2019-01-18 NOTE — Telephone Encounter (Signed)
Yes, she stated that she is awaiting the notes and will need CPT code; notes are being faxed today.

## 2019-01-19 ENCOUNTER — Telehealth: Payer: Self-pay | Admitting: Radiology

## 2019-01-19 NOTE — Telephone Encounter (Signed)
Cory Davis has emailed me to advise : Please let this e-mail serve as authorization for Dr. Romeo Apple to perform a Right Knee ACL revision surgery under Workers' Compensation on [name] # [id number].

## 2019-01-25 ENCOUNTER — Other Ambulatory Visit: Payer: Self-pay | Admitting: Radiology

## 2019-01-25 ENCOUNTER — Other Ambulatory Visit: Payer: Self-pay | Admitting: Orthopedic Surgery

## 2019-01-25 DIAGNOSIS — S83511D Sprain of anterior cruciate ligament of right knee, subsequent encounter: Secondary | ICD-10-CM

## 2019-01-25 MED ORDER — HYDROCODONE-ACETAMINOPHEN 7.5-325 MG PO TABS: 1.0000 | ORAL_TABLET | Freq: Four times a day (QID) | ORAL | 0 refills | Status: DC | PRN

## 2019-01-25 NOTE — Telephone Encounter (Signed)
Called him about cancelling surgery

## 2019-01-25 NOTE — Telephone Encounter (Signed)
Call patient about surgery. And meds

## 2019-01-26 ENCOUNTER — Encounter (HOSPITAL_COMMUNITY): Admission: RE | Admit: 2019-01-26 | Payer: PRIVATE HEALTH INSURANCE | Source: Ambulatory Visit

## 2019-01-26 MED ORDER — HYDROCODONE-ACETAMINOPHEN 7.5-325 MG PO TABS: 1.0000 | ORAL_TABLET | Freq: Four times a day (QID) | ORAL | 0 refills | Status: DC | PRN

## 2019-01-26 NOTE — Telephone Encounter (Signed)
Called patient to see if we can RS surg to May 5th he is good with this date/    Hydrocodone did not submit it printed please resend

## 2019-01-30 ENCOUNTER — Encounter: Payer: Self-pay | Admitting: Orthopedic Surgery

## 2019-02-01 ENCOUNTER — Other Ambulatory Visit: Payer: Self-pay

## 2019-02-01 DIAGNOSIS — S83511D Sprain of anterior cruciate ligament of right knee, subsequent encounter: Secondary | ICD-10-CM

## 2019-02-02 ENCOUNTER — Ambulatory Visit: Payer: Worker's Compensation | Admitting: Orthopedic Surgery

## 2019-02-02 MED ORDER — HYDROCODONE-ACETAMINOPHEN 7.5-325 MG PO TABS: 1.0000 | ORAL_TABLET | Freq: Four times a day (QID) | ORAL | 0 refills | Status: DC | PRN

## 2019-02-08 ENCOUNTER — Other Ambulatory Visit: Payer: Self-pay

## 2019-02-08 DIAGNOSIS — S83511D Sprain of anterior cruciate ligament of right knee, subsequent encounter: Secondary | ICD-10-CM

## 2019-02-09 MED ORDER — HYDROCODONE-ACETAMINOPHEN 7.5-325 MG PO TABS: 1.0000 | ORAL_TABLET | Freq: Four times a day (QID) | ORAL | 0 refills | Status: DC | PRN

## 2019-02-15 ENCOUNTER — Telehealth: Payer: Self-pay | Admitting: Radiology

## 2019-02-15 NOTE — Telephone Encounter (Signed)
Spoke to patient RS surgery again to June 2nd

## 2019-02-18 ENCOUNTER — Other Ambulatory Visit: Payer: Self-pay

## 2019-02-18 DIAGNOSIS — S83511D Sprain of anterior cruciate ligament of right knee, subsequent encounter: Secondary | ICD-10-CM

## 2019-02-19 ENCOUNTER — Telehealth: Payer: Self-pay | Admitting: Orthopedic Surgery

## 2019-02-19 ENCOUNTER — Other Ambulatory Visit: Payer: Self-pay | Admitting: Orthopedic Surgery

## 2019-02-19 DIAGNOSIS — S83511D Sprain of anterior cruciate ligament of right knee, subsequent encounter: Secondary | ICD-10-CM

## 2019-02-19 MED ORDER — HYDROCODONE-ACETAMINOPHEN 5-325 MG PO TABS: 1.0000 | ORAL_TABLET | Freq: Four times a day (QID) | ORAL | 0 refills | Status: AC | PRN

## 2019-02-19 NOTE — Telephone Encounter (Signed)
Med change dose

## 2019-02-19 NOTE — Telephone Encounter (Signed)
Patient called to follow up on medication refill which he placed through MyChart, and which appears to have been filled.  Patient is asking about staying on decreased strength of the pain medication until after he is able to have the surgery. [phone]

## 2019-02-19 NOTE — Progress Notes (Signed)
Make sure he is taking ibuprofen

## 2019-02-19 NOTE — Telephone Encounter (Signed)
Dr Romeo Apple sent in Hydrocodone 5/325   I called him to see what he is asking about. No answer/

## 2019-02-19 NOTE — Telephone Encounter (Signed)
I discussed with him

## 2019-02-22 ENCOUNTER — Other Ambulatory Visit: Payer: Self-pay

## 2019-02-22 ENCOUNTER — Ambulatory Visit (INDEPENDENT_AMBULATORY_CARE_PROVIDER_SITE_OTHER): Payer: PRIVATE HEALTH INSURANCE

## 2019-02-22 DIAGNOSIS — Z23 Encounter for immunization: Secondary | ICD-10-CM

## 2019-02-28 ENCOUNTER — Other Ambulatory Visit: Payer: Self-pay | Admitting: Radiology

## 2019-02-28 MED ORDER — HYDROCODONE-ACETAMINOPHEN 5-325 MG PO TABS: 1.0000 | ORAL_TABLET | Freq: Four times a day (QID) | ORAL | 0 refills | Status: DC | PRN

## 2019-03-08 ENCOUNTER — Other Ambulatory Visit: Payer: Self-pay

## 2019-03-08 MED ORDER — HYDROCODONE-ACETAMINOPHEN 5-325 MG PO TABS: 1.0000 | ORAL_TABLET | Freq: Four times a day (QID) | ORAL | 0 refills | Status: DC | PRN

## 2019-03-09 ENCOUNTER — Other Ambulatory Visit (HOSPITAL_COMMUNITY): Payer: Self-pay

## 2019-03-12 ENCOUNTER — Telehealth: Payer: Self-pay | Admitting: Radiology

## 2019-03-12 NOTE — Telephone Encounter (Signed)
Call patient see if he wants to RS surgery to 03/29/19 then send note to carolyn.

## 2019-03-13 ENCOUNTER — Other Ambulatory Visit: Payer: Self-pay | Admitting: Orthopedic Surgery

## 2019-03-13 DIAGNOSIS — S83511D Sprain of anterior cruciate ligament of right knee, subsequent encounter: Secondary | ICD-10-CM

## 2019-03-13 MED ORDER — PREDNISONE 10 MG (48) PO TBPK: ORAL_TABLET | Freq: Every day | ORAL | 0 refills | Status: DC

## 2019-03-13 NOTE — Progress Notes (Signed)
Swelling pain knee ; covid 19 cancelled surgery

## 2019-03-16 ENCOUNTER — Telehealth: Payer: Self-pay | Admitting: Radiology

## 2019-03-16 NOTE — Telephone Encounter (Signed)
[employer] has sent a fax asking if they can find light duty for him, what are his restrictions and can he drive

## 2019-03-16 NOTE — Telephone Encounter (Signed)
Drive yes   No work period

## 2019-03-16 NOTE — Telephone Encounter (Signed)
Faxed to [facility] to advise. Tried to RS surgery for sooner, but unable to accommodate, surgery will stay on June 2nd patient is aware. He is good with this date.

## 2019-03-17 ENCOUNTER — Other Ambulatory Visit: Payer: Self-pay

## 2019-03-19 ENCOUNTER — Other Ambulatory Visit: Payer: Self-pay | Admitting: Orthopedic Surgery

## 2019-03-19 MED ORDER — HYDROCODONE-ACETAMINOPHEN 5-325 MG PO TABS: 1.0000 | ORAL_TABLET | Freq: Four times a day (QID) | ORAL | 0 refills | Status: DC | PRN

## 2019-03-20 ENCOUNTER — Telehealth: Payer: Self-pay | Admitting: Radiology

## 2019-03-20 NOTE — Telephone Encounter (Signed)
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

## 2019-03-20 NOTE — Telephone Encounter (Signed)
Unable to move case.

## 2019-03-20 NOTE — Telephone Encounter (Signed)
Yes

## 2019-03-21 NOTE — Telephone Encounter (Signed)
Faxed to [facility] to advise.

## 2019-03-23 ENCOUNTER — Other Ambulatory Visit: Payer: Self-pay

## 2019-03-26 ENCOUNTER — Telehealth: Payer: Self-pay | Admitting: Orthopedic Surgery

## 2019-03-26 MED ORDER — HYDROCODONE-ACETAMINOPHEN 5-325 MG PO TABS: 1.0000 | ORAL_TABLET | Freq: Four times a day (QID) | ORAL | 0 refills | Status: DC | PRN

## 2019-03-26 NOTE — Telephone Encounter (Signed)
Patient would like for you to call him at your convenience. He has questions about his Prednisone, he is down to two pills and wasn't sure if that was it or not.   Please call and advise

## 2019-03-26 NOTE — Telephone Encounter (Signed)
I called him to advise, when it is done, he is finished, when he is finished, he is done.

## 2019-03-30 ENCOUNTER — Other Ambulatory Visit: Payer: Self-pay

## 2019-03-30 NOTE — Patient Instructions (Signed)
Cory Davis  03/30/2019     @PREFPERIOPPHARMACY @   Your procedure is scheduled on   04/10/2019   Report to [facility] at  615  A.M.  Call this number if you have problems the morning of surgery:  [phone]   Remember:  Do not eat or drink after midnight.                       Take these medicines the morning of surgery with A SIP OF WATER  Hydrocodone(if needed).    Do not wear jewelry, make-up or nail polish.  Do not wear lotions, powders, or perfumes, or deodorant.  Do not shave 48 hours prior to surgery.  Men may shave face and neck.  Do not bring valuables to the hospital.  [facility] is not responsible for any belongings or valuables.  Contacts, dentures or bridgework may not be worn into surgery.  Leave your suitcase in the car.  After surgery it may be brought to your room.  For patients admitted to the hospital, discharge time will be determined by your treatment team.  Patients discharged the day of surgery will not be allowed to drive home.   Name and phone number of your driver:   family Special instructions:  None  Please read over the following fact sheets that you were given. Anesthesia Post-op Instructions and Care and Recovery After Surgery       Anterior Cruciate Ligament Reconstruction, Care After This sheet gives you information about how to care for yourself after your procedure. Your health care provider may also give you more specific instructions. If you have problems or questions, contact your health care provider. What can I expect after the procedure? After your procedure, it is common to have soreness in the area where the surgical cut (incision) was made. Follow these instructions at home: If you have a splint or brace:  Wear the splint or brace as told by your health care provider. Remove it only as told by your health care provider.  Loosen the splint or brace if your toes tingle, become numb, or turn cold and  blue.  Do not let your splint or brace get wet if it is not waterproof.  Keep the splint or brace clean. Bathing  Do not take baths, swim, or use a hot tub until your health care provider approves. Ask your health care provider if you may take showers.  If your splint or brace is not waterproof, cover it with a watertight plastic bag when you take a bath or a shower.  Keep the bandage (dressing) dry until your health care provider says it can be removed. Incision care   Follow instructions from your health care provider about how to take care of your incision. Make sure you: ? Wash your hands with soap and water before you change your bandage. If soap and water are not available, use hand sanitizer. ? Change your dressing as told by your health care provider. ? Leave stitches (sutures), skin glue, or adhesive strips in place. These skin closures may need to stay in place for 2 weeks or longer. If adhesive strip edges start to loosen and curl up, you may trim the loose edges. Do not remove adhesive strips completely unless your health care provider tells you to do that.  Check your incision area every day for signs of infection. Check for: ? More redness, swelling, or  pain. ? More fluid or blood. ? Warmth. ? Pus or a bad smell. Managing pain, stiffness, and swelling   If directed, apply ice to the affected area: ? Put ice in a plastic bag. ? Place a towel between your skin and the bag. ? Leave the ice on for 20 minutes, 2-3 times per day.  Move your toes often to avoid stiffness and to lessen swelling.  Raise (elevate) the affected area above the level of your heart while you are sitting or lying down. Driving  Do not drive or operate heavy machinery while taking prescription pain medicine.  Do not drive for 24 hours if you received a sedative.  Ask your health care provider when it is safe to drive if you have a splint or brace on your leg. Activity  Return to your normal  activities as told by your health care provider. Ask your health care provider what activities are safe for you.  Do not exercise your leg unless instructed to do so by your health care provider. General instructions  Do not use the injured limb to support your body weight until your health care provider says that you can. Use crutches as told by your health care provider.  Take over-the-counter and prescription medicines only as told by your health care provider.  Keep all follow-up visits as told by your health care provider. This is important. Contact a health care provider if:  You have more redness, swelling, or pain around your incision.  You have more fluid or blood coming from your incision.  Your incision feels warm to the touch.  You have pus or a bad smell coming from your incision.  Any of your incisions break open after sutures or staples have been removed. Get help right away if:  You feel pain when you move your knee.  You have a lot of pain in your leg when you move your foot up and down at your ankle.  You have a fever. This information is not intended to replace advice given to you by your health care provider. Make sure you discuss any questions you have with your health care provider. Document Released: 05/14/2005 Document Revised: 06/07/2017 Document Reviewed: 07/20/2015 Elsevier Interactive Patient Education  2019 Elsevier Inc[employer]  General Anesthesia, Adult, Care After This sheet gives you information about how to care for yourself after your procedure. Your health care provider may also give you more specific instructions. If you have problems or questions, contact your health care provider. What can I expect after the procedure? After the procedure, the following side effects are common:  Pain or discomfort at the IV site.  Nausea.  Vomiting.  Sore throat.  Trouble concentrating.  Feeling cold or chills.  Weak or tired.  Sleepiness and  fatigue.  Soreness and body aches. These side effects can affect parts of the body that were not involved in surgery. Follow these instructions at home:  For at least 24 hours after the procedure:  Have a responsible adult stay with you. It is important to have someone help care for you until you are awake and alert.  Rest as needed.  Do not: ? Participate in activities in which you could fall or become injured. ? Drive. ? Use heavy machinery. ? Drink alcohol. ? Take sleeping pills or medicines that cause drowsiness. ? Make important decisions or sign legal documents. ? Take care of children on your own. Eating and drinking  Follow any instructions from your health care  provider about eating or drinking restrictions.  When you feel hungry, start by eating small amounts of foods that are soft and easy to digest (bland), such as toast. Gradually return to your regular diet.  Drink enough fluid to keep your urine pale yellow.  If you vomit, rehydrate by drinking water, juice, or clear broth. General instructions  If you have sleep apnea, surgery and certain medicines can increase your risk for breathing problems. Follow instructions from your health care provider about wearing your sleep device: ? Anytime you are sleeping, including during daytime naps. ? While taking prescription pain medicines, sleeping medicines, or medicines that make you drowsy.  Return to your normal activities as told by your health care provider. Ask your health care provider what activities are safe for you.  Take over-the-counter and prescription medicines only as told by your health care provider.  If you smoke, do not smoke without supervision.  Keep all follow-up visits as told by your health care provider. This is important. Contact a health care provider if:  You have nausea or vomiting that does not get better with medicine.  You cannot eat or drink without vomiting.  You have pain that  does not get better with medicine.  You are unable to pass urine.  You develop a skin rash.  You have a fever.  You have redness around your IV site that gets worse. Get help right away if:  You have difficulty breathing.  You have chest pain.  You have blood in your urine or stool, or you vomit blood. Summary  After the procedure, it is common to have a sore throat or nausea. It is also common to feel tired.  Have a responsible adult stay with you for the first 24 hours after general anesthesia. It is important to have someone help care for you until you are awake and alert.  When you feel hungry, start by eating small amounts of foods that are soft and easy to digest (bland), such as toast. Gradually return to your regular diet.  Drink enough fluid to keep your urine pale yellow.  Return to your normal activities as told by your health care provider. Ask your health care provider what activities are safe for you. This information is not intended to replace advice given to you by your health care provider. Make sure you discuss any questions you have with your health care provider. Document Released: 01/31/2001 Document Revised: 06/10/2017 Document Reviewed: 06/10/2017 Elsevier Interactive Patient Education  2019 Elsevier Inc. How to Use Chlorhexidine Before Surgery Chlorhexidine gluconate (CHG) is a germ-killing (antiseptic) solution that is used to clean the skin. It gets rid of the bacteria that normally live on the skin. Cleaning your skin with CHG before surgery helps lower the risk for infection after surgery. To clean your skin before surgery, you may be given:  A CHG solution to use in the shower.  A prepackaged cloth that contains CHG. What are the risks? Risks of using CHG include:  A skin reaction.  Hearing loss, if CHG gets in your ears.  Eye injury, if CHG gets in your eyes and is not rinsed out.  The CHG product catching fire. Make sure that you avoid  smoking and flames after applying CHG to your skin. Do not use CHG:  If you have a chlorhexidine allergy or have previously reacted to chlorhexidine.  On babies younger than 73 months of age. How to use CHG solution   Use CHG only as  told by your health care provider, and follow the instructions on the label.  Use CHG solution while taking a shower. Follow these steps when using CHG solution (unless your health care provider gives you different instructions): 1. Start the shower. 2. Use your normal soap and shampoo to wash your face and hair. 3. Turn off the shower or move out of the shower stream. 4. Pour the CHG onto a clean washcloth. Do not use any type of brush or rough-edged sponge. 5. Starting at your neck, lather your body down to your toes. Make sure you:  Pay special attention to the part of your body where you will be having surgery. Scrub this area for at least 1 minute.  Use the full amount of CHG as directed. Usually, this is one bottle.  Do not use CHG on your head or face. If the solution gets into your ears or eyes, rinse them well with water.  Avoid your genital area.  Avoid any areas of skin that have broken skin, cuts, or scrapes.  Scrub your back and under your arms. Make sure to wash skin folds. 6. Let the lather sit on your skin for 1-2 minutes or as long as told by your health care provider. 7. Thoroughly rinse your entire body in the shower. Make sure that all body creases and crevices are rinsed well. 8. Dry off with a clean towel. Do not put any substances on your body afterward, such as powder, lotion, or perfume. 9. Put on clean clothes or pajamas. 10. If it is the night before your surgery, sleep in clean sheets. How to use CHG prepackaged cloths   Only use CHG cloths as told by your health care provider, and follow the instructions on the label.  Use the CHG cloth on clean, dry skin. Follow these steps when using a CHG cloth (unless your health  care provider gives you different instructions): 1. Using the CHG cloth, vigorously scrub the part of your body where you will be having surgery. Scrub using a back-and-forth motion for 3 minutes. The area on your body should be completely wet with CHG when you are done scrubbing. 2. Do not rinse. Discard the cloth and let the area air-dry for 1 minute. Do not put any substances on your body afterward, such as powder, lotion, or perfume. 3. Put on clean clothes or pajamas. 4. If it is the night before your surgery, sleep in clean sheets. Contact a health care provider if:  Your skin gets irritated after scrubbing.  You have questions about using your solution or cloth. Get help right away if:  Your eyes become very red or swollen.  Your eyes itch badly.  Your skin itches badly and is red or swollen.  Your hearing changes.  You have trouble seeing.  You have swelling or tingling in your mouth or throat.  You have trouble breathing.  You swallow any chlorhexidine. Summary  Chlorhexidine gluconate (CHG) is a germ-killing (antiseptic) solution that is used to clean the skin. Cleaning your skin with CHG before surgery helps lower the risk for infection after surgery.  You may be given CHG to use at home. It may be in a bottle or in a prepackaged cloth to use on your skin. Carefully follow your health care provider's instructions and the instructions on the product label.  Do not use CHG if you have a chlorhexidine allergy.  Contact your health care provider if your skin gets irritated after scrubbing. This information  is not intended to replace advice given to you by your health care provider. Make sure you discuss any questions you have with your health care provider. Document Released: 07/19/2012 Document Revised: 09/22/2017 Document Reviewed: 09/22/2017 Elsevier Interactive Patient Education  2019 [employer].

## 2019-04-03 ENCOUNTER — Other Ambulatory Visit: Payer: Self-pay | Admitting: Orthopedic Surgery

## 2019-04-03 ENCOUNTER — Other Ambulatory Visit: Payer: Self-pay | Admitting: Orthopaedic Surgery

## 2019-04-03 MED ORDER — HYDROCODONE-ACETAMINOPHEN 5-325 MG PO TABS: 1.0000 | ORAL_TABLET | Freq: Four times a day (QID) | ORAL | 0 refills | Status: DC | PRN

## 2019-04-04 ENCOUNTER — Telehealth: Payer: Self-pay | Admitting: Radiology

## 2019-04-04 NOTE — Telephone Encounter (Signed)
Left message for him to call me back.  

## 2019-04-05 ENCOUNTER — Other Ambulatory Visit: Payer: Self-pay | Admitting: Orthopedic Surgery

## 2019-04-05 ENCOUNTER — Other Ambulatory Visit: Payer: Self-pay

## 2019-04-05 ENCOUNTER — Encounter (HOSPITAL_COMMUNITY)
Admission: RE | Admit: 2019-04-05 | Discharge: 2019-04-05 | Disposition: A | Discharge status: Home / Self Care | Payer: Worker's Compensation | Source: Ambulatory Visit | Attending: Orthopedic Surgery | Admitting: Orthopedic Surgery

## 2019-04-05 ENCOUNTER — Encounter (HOSPITAL_COMMUNITY): Payer: Self-pay

## 2019-04-05 DIAGNOSIS — Z01812 Encounter for preprocedural laboratory examination: Secondary | ICD-10-CM | POA: Insufficient documentation

## 2019-04-05 DIAGNOSIS — Z1159 Encounter for screening for other viral diseases: Secondary | ICD-10-CM | POA: Diagnosis not present

## 2019-04-05 HISTORY — DX: Other complications of anesthesia, initial encounter: T88.59XA

## 2019-04-05 HISTORY — DX: Personal history of urinary calculi: Z87.442

## 2019-04-05 LAB — CBC WITH DIFFERENTIAL/PLATELET
Abs Immature Granulocytes: 0.02 10*3/uL (ref 0.00–0.07)
Basophils Absolute: 0.1 10*3/uL (ref 0.0–0.1)
Basophils Relative: 1 %
Eosinophils Absolute: 0.5 10*3/uL (ref 0.0–0.5)
Eosinophils Relative: 6 %
HCT: 40.8 % (ref 39.0–52.0)
Hemoglobin: 13.6 g/dL (ref 13.0–17.0)
Immature Granulocytes: 0 %
Lymphocytes Relative: 46 %
Lymphs Abs: 3.6 10*3/uL (ref 0.7–4.0)
MCH: 29 pg (ref 26.0–34.0)
MCHC: 33.3 g/dL (ref 30.0–36.0)
MCV: 87 fL (ref 80.0–100.0)
Monocytes Absolute: 0.5 10*3/uL (ref 0.1–1.0)
Monocytes Relative: 6 %
Neutro Abs: 3.3 10*3/uL (ref 1.7–7.7)
Neutrophils Relative %: 41 %
Platelets: 224 10*3/uL (ref 150–400)
RBC: 4.69 MIL/uL (ref 4.22–5.81)
RDW: 14.1 % (ref 11.5–15.5)
WBC: 8 10*3/uL (ref 4.0–10.5)
nRBC: 0 % (ref 0.0–0.2)

## 2019-04-05 LAB — BASIC METABOLIC PANEL
Anion gap: 9 (ref 5–15)
BUN: 10 mg/dL (ref 6–20)
CO2: 22 mmol/L (ref 22–32)
Calcium: 9.2 mg/dL (ref 8.9–10.3)
Chloride: 106 mmol/L (ref 98–111)
Creatinine, Ser: 0.96 mg/dL (ref 0.61–1.24)
GFR calc Af Amer: >60 mL/min (ref >60)
GFR calc non Af Amer: >60 mL/min (ref >60)
Glucose, Bld: 100 mg/dL — ABNORMAL HIGH (ref 70–99)
Potassium: 3.7 mmol/L (ref 3.5–5.1)
Sodium: 137 mmol/L (ref 135–145)

## 2019-04-05 MED ORDER — IBUPROFEN 800 MG PO TABS: 800.0000 mg | ORAL_TABLET | Freq: Three times a day (TID) | ORAL | 3 refills | Status: DC

## 2019-04-05 NOTE — Telephone Encounter (Signed)
[facility] faxed request for Motrin 800 mg  Qty 60 Tablets  Take 1 tablet by mouth three times daily.  PATIENT USES [facility]

## 2019-04-06 ENCOUNTER — Other Ambulatory Visit (HOSPITAL_COMMUNITY)
Admission: RE | Admit: 2019-04-06 | Discharge: 2019-04-06 | Disposition: A | Discharge status: Home / Self Care | Payer: Worker's Compensation | Source: Ambulatory Visit | Attending: Orthopedic Surgery | Admitting: Orthopedic Surgery

## 2019-04-06 DIAGNOSIS — Z01812 Encounter for preprocedural laboratory examination: Secondary | ICD-10-CM | POA: Diagnosis not present

## 2019-04-07 LAB — NOVEL CORONAVIRUS, NAA (HOSP ORDER, SEND-OUT TO REF LAB; TAT 18-24 HRS): SARS-CoV-2, NAA: NOT DETECTED

## 2019-04-08 ENCOUNTER — Other Ambulatory Visit: Payer: Self-pay

## 2019-04-09 ENCOUNTER — Telehealth: Payer: Self-pay | Admitting: Orthopedic Surgery

## 2019-04-09 NOTE — Telephone Encounter (Signed)
He also wants to know if you can refill his pain meds, he states his girlfriend can not pick up the Rx for him, he will have to pick up himself, wants for after surgery.

## 2019-04-09 NOTE — Telephone Encounter (Signed)
Patient wants you to give him a return call. He is scheduled for surgery in the morning. [phone]
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
## 2019-04-09 NOTE — Telephone Encounter (Signed)
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   Surgery is in the am.

## 2019-04-10 ENCOUNTER — Ambulatory Visit (HOSPITAL_COMMUNITY): Payer: Worker's Compensation | Admitting: Anesthesiology

## 2019-04-10 ENCOUNTER — Encounter (HOSPITAL_COMMUNITY): Payer: Self-pay | Admitting: *Deleted

## 2019-04-10 ENCOUNTER — Ambulatory Visit (HOSPITAL_COMMUNITY)
Admission: RE | Admit: 2019-04-10 | Discharge: 2019-04-10 | Disposition: A | Discharge status: Home / Self Care | Payer: Worker's Compensation | Attending: Orthopedic Surgery | Admitting: Orthopedic Surgery

## 2019-04-10 ENCOUNTER — Other Ambulatory Visit: Payer: Self-pay

## 2019-04-10 ENCOUNTER — Encounter (HOSPITAL_COMMUNITY)
Admission: RE | Disposition: A | Discharge status: Home / Self Care | Payer: Self-pay | Source: Home / Self Care | Attending: Orthopedic Surgery

## 2019-04-10 DIAGNOSIS — S83511D Sprain of anterior cruciate ligament of right knee, subsequent encounter: Secondary | ICD-10-CM | POA: Diagnosis not present

## 2019-04-10 DIAGNOSIS — G8929 Other chronic pain: Secondary | ICD-10-CM | POA: Insufficient documentation

## 2019-04-10 DIAGNOSIS — R7303 Prediabetes: Secondary | ICD-10-CM | POA: Diagnosis not present

## 2019-04-10 DIAGNOSIS — Y990 Civilian activity done for income or pay: Secondary | ICD-10-CM | POA: Diagnosis not present

## 2019-04-10 DIAGNOSIS — S83511A Sprain of anterior cruciate ligament of right knee, initial encounter: Secondary | ICD-10-CM | POA: Diagnosis not present

## 2019-04-10 DIAGNOSIS — F17210 Nicotine dependence, cigarettes, uncomplicated: Secondary | ICD-10-CM | POA: Diagnosis not present

## 2019-04-10 HISTORY — PX: ANTERIOR CRUCIATE LIGAMENT REPAIR: SHX115

## 2019-04-10 SURGERY — REPAIR, KNEE, ACL
Anesthesia: General | Site: Knee | Laterality: Right

## 2019-04-10 MED ORDER — BUPIVACAINE-EPINEPHRINE (PF) 0.5% -1:200000 IJ SOLN
INTRAMUSCULAR | Status: AC
  Filled 2019-04-10: qty 60

## 2019-04-10 MED ORDER — MIDAZOLAM HCL 2 MG/2ML IJ SOLN
INTRAMUSCULAR | Status: AC
  Filled 2019-04-10: qty 2

## 2019-04-10 MED ORDER — MEPERIDINE HCL 50 MG/ML IJ SOLN: 6.2500 mg | INTRAMUSCULAR | Status: DC | PRN

## 2019-04-10 MED ORDER — PREGABALIN 50 MG PO CAPS
ORAL_CAPSULE | ORAL | Status: AC
  Filled 2019-04-10: qty 1

## 2019-04-10 MED ORDER — DEXMEDETOMIDINE HCL IN NACL 200 MCG/50ML IV SOLN
INTRAVENOUS | Status: DC | PRN
  Administered 2019-04-10: 20 ug via INTRAVENOUS
  Administered 2019-04-10: 10 ug via INTRAVENOUS

## 2019-04-10 MED ORDER — DEXMEDETOMIDINE HCL IN NACL 200 MCG/50ML IV SOLN
INTRAVENOUS | Status: AC
  Filled 2019-04-10: qty 50

## 2019-04-10 MED ORDER — ONDANSETRON HCL 4 MG/2ML IJ SOLN
INTRAMUSCULAR | Status: DC | PRN
  Administered 2019-04-10: 4 mg via INTRAVENOUS

## 2019-04-10 MED ORDER — HYDROCODONE-ACETAMINOPHEN 7.5-325 MG PO TABS
1.0000 | ORAL_TABLET | Freq: Once | ORAL | Status: DC | PRN
  Filled 2019-04-10: qty 1

## 2019-04-10 MED ORDER — PROPOFOL 10 MG/ML IV BOLUS
INTRAVENOUS | Status: AC
  Filled 2019-04-10: qty 20

## 2019-04-10 MED ORDER — GLYCOPYRROLATE PF 0.2 MG/ML IJ SOSY
PREFILLED_SYRINGE | INTRAMUSCULAR | Status: AC
  Filled 2019-04-10: qty 1

## 2019-04-10 MED ORDER — PHENYLEPHRINE 40 MCG/ML (10ML) SYRINGE FOR IV PUSH (FOR BLOOD PRESSURE SUPPORT)
PREFILLED_SYRINGE | INTRAVENOUS | Status: AC
  Filled 2019-04-10: qty 10

## 2019-04-10 MED ORDER — SUGAMMADEX SODIUM 200 MG/2ML IV SOLN
INTRAVENOUS | Status: DC | PRN
  Administered 2019-04-10: 200 mg via INTRAVENOUS

## 2019-04-10 MED ORDER — HYDROCODONE-ACETAMINOPHEN 10-325 MG PO TABS: 1.0000 | ORAL_TABLET | ORAL | 0 refills | Status: DC | PRN

## 2019-04-10 MED ORDER — ROCURONIUM BROMIDE 100 MG/10ML IV SOLN
INTRAVENOUS | Status: DC | PRN
  Administered 2019-04-10: 10 mg via INTRAVENOUS
  Administered 2019-04-10: 30 mg via INTRAVENOUS

## 2019-04-10 MED ORDER — LIDOCAINE 2% (20 MG/ML) 5 ML SYRINGE
INTRAMUSCULAR | Status: AC
  Filled 2019-04-10: qty 5

## 2019-04-10 MED ORDER — BUPIVACAINE-EPINEPHRINE 0.5% -1:200000 IJ SOLN
INTRAMUSCULAR | Status: DC | PRN
  Administered 2019-04-10: 50 mL
  Administered 2019-04-10: 10 mL

## 2019-04-10 MED ORDER — ONDANSETRON HCL 4 MG/2ML IJ SOLN
INTRAMUSCULAR | Status: AC
  Filled 2019-04-10: qty 2

## 2019-04-10 MED ORDER — SUCCINYLCHOLINE CHLORIDE 200 MG/10ML IV SOSY
PREFILLED_SYRINGE | INTRAVENOUS | Status: DC | PRN
  Administered 2019-04-10: 140 mg via INTRAVENOUS

## 2019-04-10 MED ORDER — IBUPROFEN 800 MG PO TABS: 800.0000 mg | ORAL_TABLET | Freq: Three times a day (TID) | ORAL | 1 refills | Status: DC | PRN

## 2019-04-10 MED ORDER — DEXAMETHASONE SODIUM PHOSPHATE 10 MG/ML IJ SOLN
INTRAMUSCULAR | Status: AC
  Filled 2019-04-10: qty 1

## 2019-04-10 MED ORDER — HYDROCODONE-ACETAMINOPHEN 7.5-325 MG PO TABS
1.0000 | ORAL_TABLET | Freq: Once | ORAL | Status: AC
  Administered 2019-04-10: 1 via ORAL

## 2019-04-10 MED ORDER — CEFAZOLIN SODIUM-DEXTROSE 2-4 GM/100ML-% IV SOLN
INTRAVENOUS | Status: AC
  Filled 2019-04-10: qty 100

## 2019-04-10 MED ORDER — PREGABALIN 50 MG PO CAPS
50.0000 mg | ORAL_CAPSULE | Freq: Once | ORAL | Status: AC
  Administered 2019-04-10: 50 mg via ORAL

## 2019-04-10 MED ORDER — PROPOFOL 10 MG/ML IV BOLUS
INTRAVENOUS | Status: AC
  Filled 2019-04-10: qty 40

## 2019-04-10 MED ORDER — EPHEDRINE 5 MG/ML INJ
INTRAVENOUS | Status: AC
  Filled 2019-04-10: qty 10

## 2019-04-10 MED ORDER — KETOROLAC TROMETHAMINE 30 MG/ML IJ SOLN
INTRAMUSCULAR | Status: AC
  Filled 2019-04-10: qty 1

## 2019-04-10 MED ORDER — HYDROMORPHONE HCL 1 MG/ML IJ SOLN
0.2500 mg | INTRAMUSCULAR | Status: DC | PRN
  Administered 2019-04-10 (×4): 0.5 mg via INTRAVENOUS
  Filled 2019-04-10 (×4): qty 0.5

## 2019-04-10 MED ORDER — EPINEPHRINE PF 1 MG/ML IJ SOLN
INTRAMUSCULAR | Status: AC
  Filled 2019-04-10: qty 10

## 2019-04-10 MED ORDER — CEFAZOLIN SODIUM-DEXTROSE 1-4 GM/50ML-% IV SOLN
INTRAVENOUS | Status: AC
  Filled 2019-04-10: qty 50

## 2019-04-10 MED ORDER — DEXAMETHASONE SODIUM PHOSPHATE 10 MG/ML IJ SOLN
INTRAMUSCULAR | Status: DC | PRN
  Administered 2019-04-10: 8 mg via INTRAVENOUS

## 2019-04-10 MED ORDER — ONDANSETRON HCL 4 MG/2ML IJ SOLN
4.0000 mg | Freq: Once | INTRAMUSCULAR | Status: AC
  Administered 2019-04-10: 4 mg via INTRAVENOUS

## 2019-04-10 MED ORDER — PROPOFOL 10 MG/ML IV BOLUS
INTRAVENOUS | Status: DC | PRN
  Administered 2019-04-10: 50 mg via INTRAVENOUS
  Administered 2019-04-10: 250 mg via INTRAVENOUS

## 2019-04-10 MED ORDER — PROMETHAZINE HCL 12.5 MG PO TABS: 12.5000 mg | ORAL_TABLET | Freq: Four times a day (QID) | ORAL | 0 refills | Status: DC | PRN

## 2019-04-10 MED ORDER — PROMETHAZINE HCL 25 MG/ML IJ SOLN: 6.2500 mg | INTRAMUSCULAR | Status: DC | PRN

## 2019-04-10 MED ORDER — FENTANYL CITRATE (PF) 250 MCG/5ML IJ SOLN
INTRAMUSCULAR | Status: AC
  Filled 2019-04-10: qty 5

## 2019-04-10 MED ORDER — GLYCOPYRROLATE 0.2 MG/ML IJ SOLN
INTRAMUSCULAR | Status: DC | PRN
  Administered 2019-04-10 (×2): 0.1 mg via INTRAVENOUS

## 2019-04-10 MED ORDER — LIDOCAINE HCL (CARDIAC) PF 100 MG/5ML IV SOSY
PREFILLED_SYRINGE | INTRAVENOUS | Status: DC | PRN
  Administered 2019-04-10: 100 mg via INTRAVENOUS

## 2019-04-10 MED ORDER — ROCURONIUM BROMIDE 10 MG/ML (PF) SYRINGE
PREFILLED_SYRINGE | INTRAVENOUS | Status: AC
  Filled 2019-04-10: qty 10

## 2019-04-10 MED ORDER — SODIUM CHLORIDE 0.9 % IR SOLN
Status: DC | PRN
  Administered 2019-04-10 (×2): 1000 mL

## 2019-04-10 MED ORDER — LACTATED RINGERS IV SOLN: INTRAVENOUS | Status: DC

## 2019-04-10 MED ORDER — SEVOFLURANE IN SOLN
RESPIRATORY_TRACT | Status: AC
  Filled 2019-04-10: qty 250

## 2019-04-10 MED ORDER — CHLORHEXIDINE GLUCONATE 4 % EX LIQD: 60.0000 mL | Freq: Once | CUTANEOUS | Status: DC

## 2019-04-10 MED ORDER — SUCCINYLCHOLINE CHLORIDE 200 MG/10ML IV SOSY
PREFILLED_SYRINGE | INTRAVENOUS | Status: AC
  Filled 2019-04-10: qty 10

## 2019-04-10 MED ORDER — DEXTROSE 5 % IV SOLN
3.0000 g | INTRAVENOUS | Status: AC
  Administered 2019-04-10: 3 g via INTRAVENOUS

## 2019-04-10 MED ORDER — KETOROLAC TROMETHAMINE 30 MG/ML IJ SOLN
30.0000 mg | Freq: Once | INTRAMUSCULAR | Status: AC
  Administered 2019-04-10: 30 mg via INTRAVENOUS

## 2019-04-10 MED ORDER — FENTANYL CITRATE (PF) 100 MCG/2ML IJ SOLN
INTRAMUSCULAR | Status: DC | PRN
  Administered 2019-04-10: 50 ug via INTRAVENOUS
  Administered 2019-04-10: 100 ug via INTRAVENOUS
  Administered 2019-04-10: 50 ug via INTRAVENOUS
  Administered 2019-04-10: 100 ug via INTRAVENOUS
  Administered 2019-04-10: 50 ug via INTRAVENOUS

## 2019-04-10 MED ORDER — LACTATED RINGERS IV SOLN
INTRAVENOUS | Status: DC
  Administered 2019-04-10: 1000 mL via INTRAVENOUS
  Administered 2019-04-10: 08:00:00 via INTRAVENOUS

## 2019-04-10 MED ORDER — MIDAZOLAM HCL 2 MG/2ML IJ SOLN
INTRAMUSCULAR | Status: DC | PRN
  Administered 2019-04-10: 2 mg via INTRAVENOUS

## 2019-04-10 MED ORDER — SODIUM CHLORIDE 0.9 % IR SOLN
Status: DC | PRN
  Administered 2019-04-10 (×8): 3000 mL

## 2019-04-10 SURGICAL SUPPLY — 83 items
ANCHOR BUTTON TIGHTROPE BTB (Anchor) ×2 IMPLANT
APL PRP STRL LF DISP 70% ISPRP (MISCELLANEOUS) ×2
BANDAGE ELASTIC 6 VELCRO NS (GAUZE/BANDAGES/DRESSINGS) ×2 IMPLANT
BANDAGE ESMARK 6X9 LF (GAUZE/BANDAGES/DRESSINGS) ×1 IMPLANT
BIT DRILL 2.4X128 (BIT) ×2 IMPLANT
BIT DRILL 2.4X128MM (BIT) ×1
BLADE AGGRESSIVE PLUS 4.0 (BLADE) ×3 IMPLANT
BLADE HEX COATED 2.75 (ELECTRODE) ×2 IMPLANT
BLADE OSC/SAGITTAL MD 9X18.5 (BLADE) ×3 IMPLANT
BLADE SURG SZ10 CARB STEEL (BLADE) ×6 IMPLANT
BLADE SURG SZ11 CARB STEEL (BLADE) ×3 IMPLANT
BNDG CMPR 9X6 STRL LF SNTH (GAUZE/BANDAGES/DRESSINGS) ×1
BNDG ESMARK 6X9 LF (GAUZE/BANDAGES/DRESSINGS) ×3
CHLORAPREP W/TINT 26 (MISCELLANEOUS) ×6 IMPLANT
CLOTH BEACON ORANGE TIMEOUT ST (SAFETY) ×3 IMPLANT
COOLER CRYO CUFF IC AND MOTOR (MISCELLANEOUS) ×3 IMPLANT
COVER LIGHT HANDLE STERIS (MISCELLANEOUS) ×6 IMPLANT
COVER WAND RF STERILE (DRAPES) ×2 IMPLANT
CUFF CRYO KNEE LG 20X31 COOLER (ORTHOPEDIC SUPPLIES) ×3 IMPLANT
CUFF TOURNIQUET SINGLE 34IN LL (TOURNIQUET CUFF) ×3 IMPLANT
CUTTER TOMCAT 5.0MM (BURR) ×2 IMPLANT
DECANTER SPIKE VIAL GLASS SM (MISCELLANEOUS) ×6 IMPLANT
DRILL FLIPCUTTER II 10.5MM (CUTTER) IMPLANT
ELECT REM PT RETURN 9FT ADLT (ELECTROSURGICAL) ×3
ELECTRODE REM PT RTRN 9FT ADLT (ELECTROSURGICAL) ×1 IMPLANT
FIBERSTICK 2 (SUTURE) ×2 IMPLANT
FLIPCUTTER II 10.5MM (CUTTER) ×3
FLOOR PAD 36X40 (MISCELLANEOUS) ×3
GAUZE 4X4 16PLY RFD (DISPOSABLE) ×3 IMPLANT
GAUZE SPONGE 4X4 12PLY STRL (GAUZE/BANDAGES/DRESSINGS) ×2 IMPLANT
GAUZE XEROFORM 5X9 LF (GAUZE/BANDAGES/DRESSINGS) ×3 IMPLANT
GLOVE BIOGEL M 7.0 STRL (GLOVE) ×6 IMPLANT
GLOVE BIOGEL PI IND STRL 7.0 (GLOVE) ×1 IMPLANT
GLOVE BIOGEL PI INDICATOR 7.0 (GLOVE) ×10
GLOVE SKINSENSE NS SZ8.0 LF (GLOVE) ×2
GLOVE SKINSENSE STRL SZ8.0 LF (GLOVE) ×1 IMPLANT
GLOVE SS N UNI LF 8.5 STRL (GLOVE) ×3 IMPLANT
GOWN STRL REUS W/TWL LRG LVL3 (GOWN DISPOSABLE) ×12 IMPLANT
GOWN STRL REUS W/TWL XL LVL3 (GOWN DISPOSABLE) ×3 IMPLANT
GRAFT ACHILLES CALC BNE BLCK (Bone Implant) IMPLANT
GRAFT ACHILLES TENDON (Bone Implant) ×3 IMPLANT
HLDR LEG FOAM (MISCELLANEOUS) ×1 IMPLANT
IMMOBILIZER KNEE 19 UNV (ORTHOPEDIC SUPPLIES) ×2 IMPLANT
INST SET MINOR BONE (KITS) ×3 IMPLANT
IV NS IRRIG 3000ML ARTHROMATIC (IV SOLUTION) ×21 IMPLANT
KIT BLADEGUARD II DBL (SET/KITS/TRAYS/PACK) ×3 IMPLANT
KIT TRANSTIBIAL (DISPOSABLE) ×3 IMPLANT
KIT TURNOVER CYSTO (KITS) ×3 IMPLANT
LEG HOLDER FOAM (MISCELLANEOUS) ×2
MANIFOLD NEPTUNE II (INSTRUMENTS) ×3 IMPLANT
MARKER SKIN DUAL TIP RULER LAB (MISCELLANEOUS) ×3 IMPLANT
NDL HYPO 21X1.5 SAFETY (NEEDLE) ×1 IMPLANT
NDL SPNL 18GX3.5 QUINCKE PK (NEEDLE) ×1 IMPLANT
NEEDLE HYPO 21X1.5 SAFETY (NEEDLE) ×3 IMPLANT
NEEDLE SPNL 18GX3.5 QUINCKE PK (NEEDLE) ×3 IMPLANT
NS IRRIG 1000ML POUR BTL (IV SOLUTION) ×5 IMPLANT
PACK ARTHRO LIMB DRAPE STRL (MISCELLANEOUS) ×3 IMPLANT
PACK BASIC III (CUSTOM PROCEDURE TRAY) ×3
PACK SRG BSC III STRL LF ECLPS (CUSTOM PROCEDURE TRAY) ×1 IMPLANT
PAD ABD 5X9 TENDERSORB (GAUZE/BANDAGES/DRESSINGS) ×2 IMPLANT
PAD ARMBOARD 7.5X6 YLW CONV (MISCELLANEOUS) ×3 IMPLANT
PAD FLOOR 36X40 (MISCELLANEOUS) ×1 IMPLANT
PADDING CAST COTTON 6X4 STRL (CAST SUPPLIES) ×2 IMPLANT
PENCIL HANDSWITCHING (ELECTRODE) ×3 IMPLANT
PROBE BIPOLAR ATHRO 135MM 90D (MISCELLANEOUS) ×2 IMPLANT
SCREW FT BIOCOMP 10X30 (Screw) ×2 IMPLANT
SET ARTHROSCOPY INST (INSTRUMENTS) ×3 IMPLANT
SET BASIN LINEN APH (SET/KITS/TRAYS/PACK) ×3 IMPLANT
SPONGE LAP 18X18 RF (DISPOSABLE) ×3 IMPLANT
STAPLE FIXATION W/SPIKE  MED (Staple) ×2 IMPLANT
STAPLE FIXATION W/SPIKE MED (Staple) IMPLANT
STAPLER VISISTAT 35W (STAPLE) ×2 IMPLANT
SUT ETHIBOND 5 LR DA (SUTURE) ×6 IMPLANT
SUT ETHILON 3 0 FSL (SUTURE) ×2 IMPLANT
SUT MON AB 0 CT1 (SUTURE) ×3 IMPLANT
SUT MON AB 2-0 CT1 36 (SUTURE) ×3 IMPLANT
SUT VIC AB 1 CT1 27 (SUTURE) ×3
SUT VIC AB 1 CT1 27XBRD ANTBC (SUTURE) ×1 IMPLANT
SYR 30ML LL (SYRINGE) ×3 IMPLANT
SYR BULB IRRIGATION 50ML (SYRINGE) ×6 IMPLANT
TUBING ARTHROSCOPY INFLOW/OUT (IRRIGATION / IRRIGATOR) ×3 IMPLANT
YANKAUER SUCT 12FT TUBE ARGYLE (SUCTIONS) ×3 IMPLANT
YANKAUER SUCT BULB TIP 10FT TU (MISCELLANEOUS) ×9 IMPLANT

## 2019-04-10 NOTE — Brief Op Note (Signed)
04/10/2019  10:37 AM  PATIENT:  Cory Davis  28 y.o. male  PRE-OPERATIVE DIAGNOSIS:  Right Anterior Cruciate Ligament Repair Status Post Rupture  POST-OPERATIVE DIAGNOSIS:  Right Anterior Cruciate Ligament Repair Status Post Rupture  PROCEDURE:  Procedure(s): RIGHT ANTERIOR CRUCIATE LIGAMENT (ACL) REVISION REPAIR WITH ALLOGRAFT (Right)   ACHILLES TENDON ALLOGRAFT  ARTHREX TIGHT ROPE FEMUR AND STAPLE RICHARDS TIBIA AND BIOSCREW TIBIA 10 X 30  SURGEON:  Surgeon(s) and Role:    * Vickki Hearing, MD - Primary  ASSISTANT CYNTHIA WRENN  PHYSICIAN ASSISTANT:   ANESTHESIA:   general  EBL:  50 mL   BLOOD ADMINISTERED:none  DRAINS: none   LOCAL MEDICATIONS USED:  MARCAINE  W EPI   and Amount: 60 ml  SPECIMEN:  No Specimen  DISPOSITION OF SPECIMEN:  N/A  COUNTS:  YES  DICTATION: .Dragon Dictation  PLAN OF CARE: Discharge to home after PACU  PATIENT DISPOSITION:  PACU - hemodynamically stable.   Delay start of Pharmacological VTE agent (>24hrs) due to surgical blood loss or risk of bleeding: not applicable.

## 2019-04-10 NOTE — Anesthesia Preprocedure Evaluation (Addendum)
Anesthesia Evaluation    Airway Mallampati: II       Dental  (+) Teeth Intact   Pulmonary Current Smoker,    breath sounds clear to auscultation       Cardiovascular  Rhythm:regular     Neuro/Psych PSYCHIATRIC DISORDERS Anxiety Depression  Neuromuscular disease    GI/Hepatic GERD  ,  Endo/Other    Renal/GU      Musculoskeletal   Abdominal   Peds  Hematology   Anesthesia Other Findings States npo p MN States GERD only associated with tomato sauces , relieved with Tums Reviewed comment of "awake".  Not aware under anesthesia, awoke at LMA extraction-"fell back to sleep" No issues of PONV or pain  Reproductive/Obstetrics                            Anesthesia Physical Anesthesia Plan  ASA: II  Anesthesia Plan: General   Post-op Pain Management:    Induction:   PONV Risk Score and Plan:   Airway Management Planned:   Additional Equipment:   Intra-op Plan:   Post-operative Plan:   Informed Consent: I have reviewed the patients History and Physical, chart, labs and discussed the procedure including the risks, benefits and alternatives for the proposed anesthesia with the patient or authorized representative who has indicated his/her understanding and acceptance.       Plan Discussed with: Anesthesiologist  Anesthesia Plan Comments:         Anesthesia Quick Evaluation

## 2019-04-10 NOTE — Anesthesia Postprocedure Evaluation (Signed)
Anesthesia Post Note  Patient: Cory Davis  Procedure(s) Performed: RIGHT ANTERIOR CRUCIATE LIGAMENT (ACL) REVISION REPAIR WITH ALLOGRAFT (Right Knee)  Patient location during evaluation: PACU Anesthesia Type: General Level of consciousness: awake Pain management: pain level controlled Vital Signs Assessment: post-procedure vital signs reviewed and stable Respiratory status: spontaneous breathing Cardiovascular status: stable Postop Assessment: adequate PO intake Anesthetic complications: no     Last Vitals:  Vitals:   04/10/19 1145 04/10/19 1200  BP: (!) 136/93 (!) 141/83  Pulse: 85 78  Resp: 16 15  Temp:    SpO2: 98% 98%    Last Pain:  Vitals:   04/10/19 1200  TempSrc:   PainSc: 5                  Edison Pace

## 2019-04-10 NOTE — Interval H&P Note (Signed)
History and Physical Interval Note:  04/10/2019 7:16 AM  Cory Davis  has presented today for surgery, with the diagnosis of torn right ACL s/p repair 12 years ago.  The various methods of treatment have been discussed with the patient and family. After consideration of risks, benefits and other options for treatment, the patient has consented to  Procedure(s): ANTERIOR CRUCIATE LIGAMENT (ACL) REVISION REPAIR (Right) as a surgical intervention.  The patient's history has been reviewed, patient examined, no change in status, stable for surgery.  I have reviewed the patient's chart and labs.  Questions were answered to the patient's satisfaction.     Fuller Canada

## 2019-04-10 NOTE — Op Note (Signed)
04/10/2019  10:37 AM  PATIENT:  Cory Davis  27 y.o. male  PRE-OPERATIVE DIAGNOSIS:  Right Anterior Cruciate Ligament Repair Status Post Rupture  POST-OPERATIVE DIAGNOSIS:  Right Anterior Cruciate Ligament Repair Status Post Rupture  PROCEDURE:  Procedure(s): RIGHT ANTERIOR CRUCIATE LIGAMENT (ACL) REVISION REPAIR WITH ALLOGRAFT (Right)   ACHILLES TENDON ALLOGRAFT  ARTHREX TIGHT ROPE FEMUR AND STAPLE RICHARDS TIBIA AND BIOSCREW TIBIA 10 X 30  SURGEON:  Surgeon(s) and Role:    * Jannet Calip E, MD - Primary  ASSISTANT CYNTHIA WRENN  PHYSICIAN ASSISTANT:   ANESTHESIA:   general  EBL:  50 mL   BLOOD ADMINISTERED:none  DRAINS: none   LOCAL MEDICATIONS USED:  MARCAINE  W EPI   and Amount: 60 ml  SPECIMEN:  No Specimen  DISPOSITION OF SPECIMEN:  N/A  COUNTS:  YES  DICTATION: .Dragon Dictation  PLAN OF CARE: Discharge to home after PACU  PATIENT DISPOSITION:  PACU - hemodynamically stable.   Delay start of Pharmacological VTE agent (>24hrs) due to surgical blood loss or risk of bleeding: not applicable.    

## 2019-04-10 NOTE — Transfer of Care (Signed)
Immediate Anesthesia Transfer of Care Note  Patient: Cory Davis  Procedure(s) Performed: RIGHT ANTERIOR CRUCIATE LIGAMENT (ACL) REVISION REPAIR WITH ALLOGRAFT (Right Knee)  Patient Location: PACU  Anesthesia Type:General  Level of Consciousness: awake and patient cooperative  Airway & Oxygen Therapy: Patient Spontanous Breathing  Post-op Assessment: Report given to RN and Post -op Vital signs reviewed and stable  Post vital signs: Reviewed and stable  Last Vitals:  Vitals Value Taken Time  BP 110/75 04/10/2019 10:50 AM  Temp    Pulse 87 04/10/2019 10:53 AM  Resp 23 04/10/2019 10:53 AM  SpO2 99 % 04/10/2019 10:53 AM  Vitals shown include unvalidated device data.  Last Pain:  Vitals:   04/10/19 0641  TempSrc: Oral  PainSc: 4       Patients Stated Pain Goal: 8 (04/10/19 0865)  Complications: No apparent anesthesia complications

## 2019-04-10 NOTE — Anesthesia Procedure Notes (Signed)
Procedure Name: Intubation Date/Time: 04/10/2019 7:47 AM Performed by: Georgeanne Nim, CRNA Pre-anesthesia Checklist: Patient identified, Emergency Drugs available, Suction available, Patient being monitored and Timeout performed Patient Re-evaluated:Patient Re-evaluated prior to induction Oxygen Delivery Method: Circle system utilized Preoxygenation: Pre-oxygenation with 100% oxygen Induction Type: IV induction Ventilation: Mask ventilation without difficulty Laryngoscope Size: Mac and 4 Grade View: Grade I Tube type: Oral Tube size: 8.0 mm Number of attempts: 1 Airway Equipment and Method: Stylet Placement Confirmation: ETT inserted through vocal cords under direct vision,  positive ETCO2,  CO2 detector and breath sounds checked- equal and bilateral Secured at: 23 cm Tube secured with: Tape Dental Injury: Teeth and Oropharynx as per pre-operative assessment

## 2019-04-10 NOTE — Op Note (Signed)
04/10/2019  10:37 AM  PATIENT:  Cory Davis  28 y.o. male  PRE-OPERATIVE DIAGNOSIS:  Right Anterior Cruciate Ligament Repair Status Post Rupture  POST-OPERATIVE DIAGNOSIS:  Right Anterior Cruciate Ligament Repair Status Post Rupture  PROCEDURE:  Procedure(s): RIGHT ANTERIOR CRUCIATE LIGAMENT (ACL) REVISION REPAIR WITH ALLOGRAFT (Right)   ACHILLES TENDON ALLOGRAFT  ARTHREX TIGHT ROPE FEMUR AND STAPLE RICHARDS TIBIA AND BIOSCREW TIBIA 10 X 30  SURGEON:  Surgeon(s) and Role:    * Vickki HearingHarrison, Stanley E, MD - Primary  Details of procedure Mr. Wooton was seen in the preop area his chart was reviewed and his images were reviewed in his right knee was confirmed as the surgical site and marked. He was taken to the operating room he was given 3 g of Ancef based on his weight of 117 kg.  He was given general anesthesia and then he had an exam under anesthesia  Under anesthesia he had a 2+ Lockman test a grade 2 pivot shift test collateral ligaments were stable he had full range of motion extension and flexion compared to his left knee  After sterile prep and drape diagnostic arthroscopy was performed through a lateral portal and the ACL was torn completely with some tissue remaining on the tibial side.  The medial and lateral compartments were normal noting the previous lateral meniscectomy which was partial.  The patient still had 75 to 80% of his meniscus laterally intact.  There were no chondral lesions  Patellofemoral joint was normal  I made a second portal and placed a probe and then repeated the diagnostic arthroscopy using the probe to confirm integrity of the menisci  I then made us second incision laterally and passed the point-to-point femoral guide at 115 degrees to make a new femoral tunnel.  The prior tunnel was approximately 3 mm superior to the current tunnel made today.  To locate this tunnel we used a medial portal as a viewing portal used a chondral pick to make a hole in  the medial face of the lateral femoral condyle to allow the point-to-point guide to anchor.  We drilled into the femur.  I did re-drill as I was unhappy with the first tunnel position.  10.5 tunnel was established  We passed a suture and placed it outside the knee joint through the lateral portal for later passing of the graft  I then drilled the tibial tunnel through a separate tibial incision.  The guide was set for 55 the guide was set in the joint in the ACL footprint near the lateral meniscus anterior horn  We used a medial tibial spine as a landmark.  We drilled this tunnel with a 11 mm reamer.  I then prepared the graft on the back table with a 22 x 10.5 bone block.  I used #1 Vicryl suture to tubularize the graft.  We passed the tight rope sutures and then placed the graft under tension  I then used the previously placed suture to pass the graft through the tibial tunnel and using the tight rope pull the graft into the femoral tunnel.  I viewed through the medial portal to do this.  I then switched to the lateral portal.  I checked the excursion of the graft which was excellent in extension.  We had good filling of the tunnel without impingement.  I then cycled the knee several times.  I then placed the knee in extension and passed a 10 x 30  tibial screw to and a staple  using a Richards staple size medium  I then looked back into the joint the graft had excellent tension I checked the Lockman it was excellent and stable  I irrigated the joint and closed the lateral wound with 0 Monocryl and 2-0 Monocryl and staples I closed the medial wound with 2-0 Monocryl and I closed the portals with 3-0 nylon  Sterile bandages Ace bandage Cryo/Cuff and knee immobilizer were applied  The patient was extubated taken recovery room in stable condition  ASSISTANT CYNTHIA WRENN  PHYSICIAN ASSISTANT:   ANESTHESIA:   general  EBL:  50 mL   BLOOD ADMINISTERED:none  DRAINS: none   LOCAL  MEDICATIONS USED:  MARCAINE  W EPI   and Amount: 60 ml  SPECIMEN:  No Specimen  DISPOSITION OF SPECIMEN:  N/A  COUNTS:  YES  DICTATION: .Dragon Dictation  PLAN OF CARE: Discharge to home after PACU  PATIENT DISPOSITION:  PACU - hemodynamically stable.   Delay start of Pharmacological VTE agent (>24hrs) due to surgical blood loss or risk of bleeding: not applicable.

## 2019-04-10 NOTE — Discharge Instructions (Signed)
PATIENT INSTRUCTIONS POST-ANESTHESIA  IMMEDIATELY FOLLOWING SURGERY:  Do not drive or operate machinery for the first twenty four hours after surgery.  Do not make any important decisions for twenty four hours after surgery or while taking narcotic pain medications or sedatives.  If you develop intractable nausea and vomiting or a severe headache please notify your doctor immediately.  FOLLOW-UP:  Please make an appointment with your surgeon as instructed. You do not need to follow up with anesthesia unless specifically instructed to do so.  WOUND CARE INSTRUCTIONS (if applicable):  Keep a dry clean dressing on the anesthesia/puncture wound site if there is drainage.  Once the wound has quit draining you may leave it open to air.  Generally you should leave the bandage intact for twenty four hours unless there is drainage.  If the epidural site drains for more than 36-48 hours please call the anesthesia department.  QUESTIONS?:  Please feel free to call your physician or the hospital operator if you have any questions, and they will be happy to assist you.      WEIGHT BEARING AS TOLERATED IN THE BRACE   LEAVE THE BRACE ON FOR 48 HRS TO KEEP THE KNEE STRAIGHT  USE THE CRYOCUFF TO ICE THE KNEE  DO 25 ANKLE PUMPS EVERY HOUR WHILE AWAKE  IF THE BRACE GETS TIGHT YOU MAY LOOSEN IT//

## 2019-04-11 ENCOUNTER — Encounter (HOSPITAL_COMMUNITY): Payer: Self-pay | Admitting: Orthopedic Surgery

## 2019-04-11 ENCOUNTER — Telehealth: Payer: Self-pay | Admitting: Orthopedic Surgery

## 2019-04-11 NOTE — Telephone Encounter (Signed)
Patient called to "leave message for Amy" - states since surgery yesterday, 04/10/19, his foot still has some numbness; asking if this is normal. Ph# [phone].  He is aware of post op appointment which we have just scheduled.

## 2019-04-12 NOTE — Telephone Encounter (Signed)
I did advise him to loosen ace bandage, and keep ice on it as well, thanks

## 2019-04-12 NOTE — Telephone Encounter (Signed)
I spoke to him told him the numbness should improve, if it gets worse instead of better let me know, he has voiced understanding  To you FYI

## 2019-04-12 NOTE — Telephone Encounter (Signed)
He can loosen his brace and the ace bandage and the cryocuff , that should help

## 2019-04-16 ENCOUNTER — Encounter: Payer: Self-pay | Admitting: Orthopedic Surgery

## 2019-04-16 ENCOUNTER — Other Ambulatory Visit: Payer: Self-pay

## 2019-04-16 ENCOUNTER — Ambulatory Visit (INDEPENDENT_AMBULATORY_CARE_PROVIDER_SITE_OTHER): Payer: Worker's Compensation | Admitting: Orthopedic Surgery

## 2019-04-16 ENCOUNTER — Telehealth: Payer: Self-pay | Admitting: Orthopedic Surgery

## 2019-04-16 DIAGNOSIS — Z9889 Other specified postprocedural states: Secondary | ICD-10-CM

## 2019-04-16 MED ORDER — HYDROCODONE-ACETAMINOPHEN 10-325 MG PO TABS: 1.0000 | ORAL_TABLET | Freq: Four times a day (QID) | ORAL | 0 refills | Status: DC | PRN

## 2019-04-16 NOTE — Addendum Note (Signed)
Addended byCandice Camp on: 04/16/2019 02:09 PM   Modules accepted: Orders

## 2019-04-16 NOTE — Patient Instructions (Addendum)
Ice 6 x a day for 30 min  Place weight on there foot   Continue ibuprofen and Norco 10 mg   Needs note: to not testify for 4 weeks

## 2019-04-16 NOTE — Telephone Encounter (Signed)
Ok thanks 

## 2019-04-16 NOTE — Progress Notes (Signed)
Chief Complaint  Patient presents with  . Routine Post Op    04/10/19 ACL repair    ACL reconstruction right knee with allograft no meniscal damage No cartilage damage  He did have some numbness in his foot that is resolving  His leg looks good he looks neurovascularly intact he has no calf pain or tenderness he has some altered sensation on the dorsum of the foot none proximal to that  All incisions look clean dry and intact without drainage  His knee is flat and straight he did come in nonweightbearing but I told him to go ahead and be weightbearing as tolerated  Start physical therapy as soon as we get an appointment refill his medications  Ice 6 times a day  Follow-up in a week for staples to come out  Meds ordered this encounter  Medications  . HYDROcodone-acetaminophen (NORCO) 10-325 MG tablet    Sig: Take 1 tablet by mouth every 6 (six) hours as needed for up to 7 days.    Dispense:  28 tablet    Refill:  0    .

## 2019-04-16 NOTE — Telephone Encounter (Signed)
Nurse case manager relays she must go through 3rd party, [employer] to request; therefore, said order needed to start the request. I can update provider information, once approval is received.

## 2019-04-16 NOTE — Telephone Encounter (Signed)
Faxed therapy order and notes to Workers comp, case Public affairs consultant fax [phone] and copy to adjuster Sharlyn Bologna fax# [phone]; awaiting approval from their 3rd party contact [place].

## 2019-04-16 NOTE — Telephone Encounter (Signed)
Patient's nurse case manager Tobey Bride, [phone] / fax# [phone] called to inquire about physical therapy per today's office visit (04/16/19). States patient relays Dr Aline Brochure mentioned [facility] as possible location if approved. Please advise regarding therapy referral.  I will fax it with notes to Manuela Schwartz and copy to adjuster, Sharlyn Bologna [phone].

## 2019-04-16 NOTE — Telephone Encounter (Signed)
Yes, PT needed Will you see if they will approve at [facility] ?

## 2019-04-20 ENCOUNTER — Ambulatory Visit (INDEPENDENT_AMBULATORY_CARE_PROVIDER_SITE_OTHER): Payer: Worker's Compensation | Admitting: Orthopedic Surgery

## 2019-04-20 ENCOUNTER — Other Ambulatory Visit: Payer: Self-pay

## 2019-04-20 VITALS — Temp 97.0°F

## 2019-04-20 DIAGNOSIS — Z9889 Other specified postprocedural states: Secondary | ICD-10-CM

## 2019-04-20 MED ORDER — HYDROCODONE-ACETAMINOPHEN 7.5-325 MG PO TABS: 1.0000 | ORAL_TABLET | ORAL | 0 refills | Status: DC | PRN

## 2019-04-20 NOTE — Patient Instructions (Signed)
Ice the knee : a lot   Elevate the knee and leg  New pain meds for q4 hrs   Change appt from 15th to 19 th

## 2019-04-20 NOTE — Progress Notes (Signed)
POSTOP VISIT  POD # 10  Chief Complaint  Patient presents with  . Routine Post Op    DOS 04/10/19    C/o pain and swelling right thigh especially along the lateral thigh wound.  Right knee effusion doesn't seem to be excessive all compartments are soft and there is no calf tenderness   All staples were extracted wounds look clean dry and intact   Encounter Diagnosis  Name Primary?  . S/P ACL repair 04/10/19 recurrent ACL tear, redo repair  Yes    Change appointment to Friday instead of Wednesday  Another order for therapy was given therapy will be done at [facility]   Postoperative plan (Work, [employer],  Meds ordered this encounter  Medications  . DISCONTD: HYDROcodone-acetaminophen (NORCO) 7.5-325 MG tablet    Sig: Take 1 tablet by mouth every 4 (four) hours as needed for moderate pain.    Dispense:  30 tablet    Refill:  0  . HYDROcodone-acetaminophen (NORCO) 7.5-325 MG tablet    Sig: Take 1 tablet by mouth every 4 (four) hours as needed for moderate pain.    Dispense:  30 tablet    Refill:  0  ,FU)  1 week

## 2019-04-23 ENCOUNTER — Ambulatory Visit: Payer: PRIVATE HEALTH INSURANCE | Admitting: Orthopedic Surgery

## 2019-04-23 NOTE — Telephone Encounter (Signed)
Patient has been approved per Work Comp, nurse case manager Tobey Bride ph [phone] for physical therapy at [place], [place]. Spoke w/Katie at [facility] - appointment is scheduled 04/25/19 at 4:00pm; pt aware. Faxed order and demographic sheet as requested. Entered this updated information in referral workque.

## 2019-04-25 ENCOUNTER — Telehealth: Payer: Self-pay | Admitting: Orthopedic Surgery

## 2019-04-25 ENCOUNTER — Other Ambulatory Visit: Payer: Self-pay

## 2019-04-25 DIAGNOSIS — Z9889 Other specified postprocedural states: Secondary | ICD-10-CM

## 2019-04-25 NOTE — Telephone Encounter (Signed)
Faxed

## 2019-04-25 NOTE — Telephone Encounter (Signed)
Katie from [facility] called asking if Dr Aline Brochure has an ACL protocol that they need to follow? Please call her or fax her  Thanks  Phone   [phone] Fax        [phone]

## 2019-04-25 NOTE — Telephone Encounter (Signed)
Immediate FWD] Cryotherapy Wk 0-6 AAROM AROM PROM  QUADRICEPS EXERCISES /  HAMSTRING EXERCISE   OBTAIN 0-120 BY 6 WEEKS  WK 6-12 CONTINUE AND ADVANCE AS TOLERATED

## 2019-04-26 MED ORDER — HYDROCODONE-ACETAMINOPHEN 7.5-325 MG PO TABS: 1.0000 | ORAL_TABLET | ORAL | 0 refills | Status: DC | PRN

## 2019-04-27 ENCOUNTER — Other Ambulatory Visit: Payer: Self-pay

## 2019-04-27 ENCOUNTER — Encounter: Payer: Self-pay | Admitting: Orthopedic Surgery

## 2019-04-27 ENCOUNTER — Ambulatory Visit (INDEPENDENT_AMBULATORY_CARE_PROVIDER_SITE_OTHER): Payer: Worker's Compensation | Admitting: Orthopedic Surgery

## 2019-04-27 VITALS — Temp 97.9°F | Ht 74.0 in | Wt 260.0 lb

## 2019-04-27 DIAGNOSIS — Z9889 Other specified postprocedural states: Secondary | ICD-10-CM

## 2019-04-27 NOTE — Progress Notes (Addendum)
POSTOP VISIT  POD # 90 Chief Complaint  Patient presents with  . Routine Post Op    DOS 04/10/19   Current Outpatient Medications:  .  HYDROcodone-acetaminophen (NORCO) 7.5-325 MG tablet, Take 1 tablet by mouth every 4 (four) hours as needed for up to 7 days for moderate pain., Disp: 42 tablet, Rfl: 0 .  ibuprofen (ADVIL) 800 MG tablet, Take 1 tablet (800 mg total) by mouth every 8 (eight) hours as needed., Disp: 90 tablet, Rfl: 1 Encounter Diagnosis  Name Primary?  . S/P ACL repair 04/10/19 recurrent ACL tear, redo repair  Yes     17 days after ACL reconstruction allograft improved swelling pain medication seems to be working.  Suture abscess was removed dissolvable suture was removed Band-Aid applied   Encounter Diagnosis  Name Primary?  . S/P ACL repair 04/10/19 recurrent ACL tear, redo repair  Yes    Hinged knee brace continue therapy weight-bear as tolerated follow-up 3 weeks  Postoperative plan (Work, WB, No orders of the defined types were placed in this encounter. ,FU)  3 weeks   Brace 0-90 T scope

## 2019-04-27 NOTE — Patient Instructions (Addendum)
[place] for interview  Ok for on [employer] a lot PT  Weight bearing as tolerated

## 2019-05-01 ENCOUNTER — Other Ambulatory Visit: Payer: Self-pay

## 2019-05-01 DIAGNOSIS — Z9889 Other specified postprocedural states: Secondary | ICD-10-CM

## 2019-05-02 MED ORDER — HYDROCODONE-ACETAMINOPHEN 7.5-325 MG PO TABS: 1.0000 | ORAL_TABLET | ORAL | 0 refills | Status: DC | PRN

## 2019-05-07 ENCOUNTER — Other Ambulatory Visit: Payer: Self-pay | Admitting: Orthopedic Surgery

## 2019-05-07 DIAGNOSIS — M25561 Pain in right knee: Secondary | ICD-10-CM

## 2019-05-07 DIAGNOSIS — G8918 Other acute postprocedural pain: Secondary | ICD-10-CM

## 2019-05-07 MED ORDER — HYDROCODONE-ACETAMINOPHEN 5-325 MG PO TABS: 1.0000 | ORAL_TABLET | ORAL | 0 refills | Status: DC | PRN

## 2019-05-13 ENCOUNTER — Other Ambulatory Visit: Payer: Self-pay

## 2019-05-13 DIAGNOSIS — G8918 Other acute postprocedural pain: Secondary | ICD-10-CM

## 2019-05-13 DIAGNOSIS — M25561 Pain in right knee: Secondary | ICD-10-CM

## 2019-05-14 MED ORDER — HYDROCODONE-ACETAMINOPHEN 5-325 MG PO TABS: 1.0000 | ORAL_TABLET | ORAL | 0 refills | Status: DC | PRN

## 2019-05-16 ENCOUNTER — Other Ambulatory Visit: Payer: Self-pay

## 2019-05-16 ENCOUNTER — Ambulatory Visit (INDEPENDENT_AMBULATORY_CARE_PROVIDER_SITE_OTHER): Payer: Worker's Compensation | Admitting: Orthopedic Surgery

## 2019-05-16 ENCOUNTER — Encounter: Payer: Self-pay | Admitting: Orthopedic Surgery

## 2019-05-16 VITALS — BP 119/73 | HR 104 | Temp 97.2°F | Ht 74.0 in | Wt 267.0 lb

## 2019-05-16 DIAGNOSIS — Z9889 Other specified postprocedural states: Secondary | ICD-10-CM

## 2019-05-16 NOTE — Patient Instructions (Signed)

## 2019-05-16 NOTE — Progress Notes (Signed)
Chief Complaint  Patient presents with  . Knee Pain    R/ sore/aching and swelling at times   Encounter Diagnosis  Name Primary?  . 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

## 2019-05-18 ENCOUNTER — Ambulatory Visit: Payer: Worker's Compensation | Admitting: Orthopedic Surgery

## 2019-05-20 ENCOUNTER — Other Ambulatory Visit: Payer: Self-pay

## 2019-05-20 DIAGNOSIS — M25561 Pain in right knee: Secondary | ICD-10-CM

## 2019-05-20 DIAGNOSIS — G8918 Other acute postprocedural pain: Secondary | ICD-10-CM

## 2019-05-21 MED ORDER — HYDROCODONE-ACETAMINOPHEN 5-325 MG PO TABS: 1.0000 | ORAL_TABLET | ORAL | 0 refills | Status: DC | PRN

## 2019-05-22 ENCOUNTER — Telehealth: Payer: Self-pay | Admitting: Orthopedic Surgery

## 2019-05-22 ENCOUNTER — Encounter: Payer: Self-pay | Admitting: Orthopedic Surgery

## 2019-05-22 DIAGNOSIS — Z9889 Other specified postprocedural states: Secondary | ICD-10-CM

## 2019-05-22 NOTE — Telephone Encounter (Signed)
Call received from patient's worker's comp nurse case manager Tobey Bride, [phone] / fax# [phone], requesting update physical therapy order, per [facility], [place].

## 2019-05-22 NOTE — Telephone Encounter (Signed)
Faxed, thanks

## 2019-05-27 ENCOUNTER — Other Ambulatory Visit: Payer: Self-pay

## 2019-05-27 DIAGNOSIS — G8918 Other acute postprocedural pain: Secondary | ICD-10-CM

## 2019-05-28 MED ORDER — HYDROCODONE-ACETAMINOPHEN 5-325 MG PO TABS: 1.0000 | ORAL_TABLET | ORAL | 0 refills | Status: DC | PRN

## 2019-06-04 ENCOUNTER — Other Ambulatory Visit: Payer: Self-pay

## 2019-06-04 DIAGNOSIS — G8918 Other acute postprocedural pain: Secondary | ICD-10-CM

## 2019-06-04 MED ORDER — HYDROCODONE-ACETAMINOPHEN 5-325 MG PO TABS: 1.0000 | ORAL_TABLET | ORAL | 0 refills | Status: AC | PRN

## 2019-06-06 ENCOUNTER — Other Ambulatory Visit: Payer: Self-pay

## 2019-06-06 ENCOUNTER — Encounter: Payer: Self-pay | Admitting: Orthopedic Surgery

## 2019-06-06 ENCOUNTER — Ambulatory Visit (INDEPENDENT_AMBULATORY_CARE_PROVIDER_SITE_OTHER): Payer: Worker's Compensation | Admitting: Orthopedic Surgery

## 2019-06-06 VITALS — Temp 99.3°F

## 2019-06-06 DIAGNOSIS — Z9889 Other specified postprocedural states: Secondary | ICD-10-CM

## 2019-06-06 NOTE — Progress Notes (Signed)
Chief Complaint  Patient presents with  . Routine Post Op    04/10/2019 right ACL repair    Cory Davis comes in at 6 weeks after surgery making good progress he is regained his range of motion his knee feels stable  He is tapering his pain medication  He is out of work  Continue therapy 6 weeks  Follow-up 4 weeks

## 2019-06-06 NOTE — Patient Instructions (Signed)
Brace can come off   No work

## 2019-06-13 MED ORDER — HYDROCODONE-ACETAMINOPHEN 5-325 MG PO TABS: 1.0000 | ORAL_TABLET | Freq: Four times a day (QID) | ORAL | 0 refills | Status: DC | PRN

## 2019-06-20 ENCOUNTER — Other Ambulatory Visit: Payer: Self-pay

## 2019-06-20 MED ORDER — HYDROCODONE-ACETAMINOPHEN 5-325 MG PO TABS: 1.0000 | ORAL_TABLET | Freq: Four times a day (QID) | ORAL | 0 refills | Status: DC | PRN

## 2019-06-28 ENCOUNTER — Other Ambulatory Visit: Payer: Self-pay

## 2019-06-28 MED ORDER — HYDROCODONE-ACETAMINOPHEN 5-325 MG PO TABS: 1.0000 | ORAL_TABLET | Freq: Four times a day (QID) | ORAL | 0 refills | Status: DC | PRN

## 2019-07-04 ENCOUNTER — Other Ambulatory Visit: Payer: Self-pay

## 2019-07-04 ENCOUNTER — Other Ambulatory Visit: Payer: Self-pay | Admitting: Orthopedic Surgery

## 2019-07-04 ENCOUNTER — Ambulatory Visit: Payer: PRIVATE HEALTH INSURANCE | Admitting: Orthopedic Surgery

## 2019-07-04 ENCOUNTER — Encounter: Payer: Self-pay | Admitting: Family Medicine

## 2019-07-04 DIAGNOSIS — Z9889 Other specified postprocedural states: Secondary | ICD-10-CM

## 2019-07-04 MED ORDER — HYDROCODONE-ACETAMINOPHEN 5-325 MG PO TABS: 1.0000 | ORAL_TABLET | Freq: Three times a day (TID) | ORAL | 0 refills | Status: DC | PRN

## 2019-07-04 NOTE — Progress Notes (Signed)
Meds ordered this encounter  Medications  . HYDROcodone-acetaminophen (NORCO/VICODIN) 5-325 MG tablet    Sig: Take 1 tablet by mouth every 8 (eight) hours as needed for up to 7 days for moderate pain.    Dispense:  21 tablet    Refill:  0    Dosage reduction

## 2019-07-05 ENCOUNTER — Encounter: Payer: Self-pay | Admitting: Family Medicine

## 2019-07-05 ENCOUNTER — Encounter: Payer: Self-pay | Admitting: Orthopedic Surgery

## 2019-07-05 ENCOUNTER — Ambulatory Visit (INDEPENDENT_AMBULATORY_CARE_PROVIDER_SITE_OTHER): Payer: PRIVATE HEALTH INSURANCE | Admitting: Family Medicine

## 2019-07-05 ENCOUNTER — Other Ambulatory Visit: Payer: Self-pay

## 2019-07-05 ENCOUNTER — Ambulatory Visit (INDEPENDENT_AMBULATORY_CARE_PROVIDER_SITE_OTHER): Payer: Worker's Compensation | Admitting: Orthopedic Surgery

## 2019-07-05 VITALS — BP 122/72 | HR 81 | Temp 97.3°F | Ht 74.0 in | Wt 260.0 lb

## 2019-07-05 DIAGNOSIS — Z9889 Other specified postprocedural states: Secondary | ICD-10-CM

## 2019-07-05 DIAGNOSIS — F411 Generalized anxiety disorder: Secondary | ICD-10-CM | POA: Diagnosis not present

## 2019-07-05 DIAGNOSIS — F4310 Post-traumatic stress disorder, unspecified: Secondary | ICD-10-CM

## 2019-07-05 DIAGNOSIS — R454 Irritability and anger: Secondary | ICD-10-CM

## 2019-07-05 MED ORDER — BUPROPION HCL ER (SR) 150 MG PO TB12: 150.0000 mg | ORAL_TABLET | Freq: Two times a day (BID) | ORAL | 5 refills | Status: DC

## 2019-07-05 NOTE — Progress Notes (Signed)
Chief Complaint  Patient presents with  . Routine Post Op    ACL repair 04/10/19    Postop week 12 ACL reconstruction right knee doing well some soreness after therapy  Everything looks very good he is regained full range of motion his graft feels excellent  Is testing showed a 40% quadriceps deficit  Recommend repeat therapy  Follow-up in 2 months  ACL brace will be ordered functional brace in October  Encounter Diagnosis  Name Primary?  . 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

## 2019-07-05 NOTE — Addendum Note (Signed)
Addended byCandice Camp on: 07/05/2019 03:39 PM   Modules accepted: Orders

## 2019-07-05 NOTE — Patient Instructions (Signed)
Sept 8th  Training ok sit down only

## 2019-07-05 NOTE — Progress Notes (Signed)
   Subjective:  Patient presents for very protracted discussion  Patient ID: Cory Davis, male    DOB: 1991/05/16, 28 y.o.   MRN: [id number]  HPI Pt states he has had issues with his temper for a while. Pt was in the [employer] and was put on Adderall and that mellowed him out through out the day. Pt states he gets irritable easy. Pt states that this is not an everyday thing. Pt states he gets frustrated easy and mad easy. Pt states that his wife has noticed his behavior.  Virtual Visit via Video Note  I connected with Cory Davis on 07/05/19 at  9:30 AM EDT by a video enabled telemedicine application and verified that I am speaking with the correct person using two identifiers.  Location: Patient: home Provider: office   I discussed the limitations of evaluation and management by telemedicine and the availability of in person appointments. The patient expressed understanding and agreed to proceed.  History of Present Illness:    Observations/Objective:   Assessment and Plan:   Follow Up Instructions:    I discussed the assessment and treatment plan with the patient. The patient was provided an opportunity to ask questions and all were answered. The patient agreed with the plan and demonstrated an understanding of the instructions.   The patient was advised to call back or seek an in-person evaluation if the symptoms worsen or if the condition fails to improve as anticipated.  I provided 25 minutes of non-face-to-face time during this encounter.  Patient has Cory Males, LPN Patient has a longstanding history of anger issues.  Worse recently.  Under a fair amount of stress  Recently had a substantial injury.  Requiring several narcotic tablets per day surgery was 2 months ago or so.  Medication and given by Dr. Aline Brochure.  Patient working on getting this down and with goal of eventually stop  History of PTSD.  Reports no panic attacks.  Reports 70s features overall have  improved with time  Saw a psychiatrist over a year ago see prior notes would prefer not to go back to psychiatrist  Has history of ADHD.  Took medicine.  Noted the medicines helped those symptoms some but seem to help the anger more  Claims no substantial depression at this time.  Claims no substantial anxiety Review of Systems No headache, no major weight loss or weight gain, no chest pain no back pain abdominal pain no change in bowel habits complete ROS otherwise negative     Objective:   Physical Exam  Virtual      Assessment & Plan:  Impression anger issues with comorbidities of PTSD and element of depression and element of anxiety and element of ADHD.  Discussed at great length.  I would really prefer not to introduce yet another controlled substance to the patient's regimen.  We will hold off on Adderall.  Rationale discussed with patient.  Will initiate Wellbutrin.  Potential side effects benefits discussed follow-up in a month

## 2019-07-11 ENCOUNTER — Other Ambulatory Visit: Payer: Self-pay

## 2019-07-11 DIAGNOSIS — Z9889 Other specified postprocedural states: Secondary | ICD-10-CM

## 2019-07-11 MED ORDER — HYDROCODONE-ACETAMINOPHEN 5-325 MG PO TABS: 1.0000 | ORAL_TABLET | Freq: Three times a day (TID) | ORAL | 0 refills | Status: DC | PRN

## 2019-07-13 ENCOUNTER — Encounter: Payer: Self-pay | Admitting: Family Medicine

## 2019-07-13 NOTE — Telephone Encounter (Signed)
Nurses it would be fine for the patient to stop the Wellbutrin  Please explained to the patient that his doctor will be back in the office on Tuesday Please verify with patient he is willing to wait till Tuesday so that Dr. Richardson Landry can decide what next choice to use

## 2019-07-13 NOTE — Telephone Encounter (Signed)
Discussed with patient. Patient will stop the Wellbutrin today and await Dr Richardson Landry to prescribe a new medication on Tuesday.

## 2019-07-17 ENCOUNTER — Encounter: Payer: Self-pay | Admitting: *Deleted

## 2019-07-17 MED ORDER — ESCITALOPRAM OXALATE 10 MG PO TABS: 10.0000 mg | ORAL_TABLET | ORAL | 5 refills | Status: DC

## 2019-07-17 NOTE — Telephone Encounter (Signed)
Cory Kirschner, MD  You 11 hours ago (9:20 PM)   [name]. Doc intolerance of wellbutrin in chart. Initiate lexapro 10 one qam six mo worth. Stop wellbutrin

## 2019-07-17 NOTE — Addendum Note (Signed)
Addended by: Carmelina Noun on: 07/17/2019 09:55 AM   Modules accepted: Orders

## 2019-07-17 NOTE — Telephone Encounter (Signed)
Called pt and discussed with him to start lexapro 10mg  one every morning to and call back if problems. Med sent to pharm. Pt verbalized understanding of all.

## 2019-07-18 ENCOUNTER — Other Ambulatory Visit: Payer: Self-pay | Admitting: Orthopedic Surgery

## 2019-07-18 ENCOUNTER — Other Ambulatory Visit: Payer: Self-pay

## 2019-07-18 DIAGNOSIS — Z9889 Other specified postprocedural states: Secondary | ICD-10-CM

## 2019-07-18 MED ORDER — HYDROCODONE-ACETAMINOPHEN 5-325 MG PO TABS: 1.0000 | ORAL_TABLET | Freq: Two times a day (BID) | ORAL | 0 refills | Status: AC

## 2019-07-25 ENCOUNTER — Telehealth: Payer: Self-pay | Admitting: Radiology

## 2019-07-25 NOTE — Telephone Encounter (Signed)
Call him and I ll see him 1030 am thurs

## 2019-07-25 NOTE — Telephone Encounter (Signed)
2 days ago felt a pop,now with increased pain near patella.  I called him and told him we can work him in on Friday, what time do you want to see him?  Told him to use ice on the knee, and use brace when he is up and moving around, he voiced understanding

## 2019-07-25 NOTE — Telephone Encounter (Signed)
I spoke to him and to his [facility] nurse  He will be here tomorrow at 10:30

## 2019-07-25 NOTE — Telephone Encounter (Signed)
Noted. Appointment created.

## 2019-07-26 ENCOUNTER — Ambulatory Visit: Payer: PRIVATE HEALTH INSURANCE

## 2019-07-26 ENCOUNTER — Encounter: Payer: Self-pay | Admitting: Orthopedic Surgery

## 2019-07-26 ENCOUNTER — Ambulatory Visit (INDEPENDENT_AMBULATORY_CARE_PROVIDER_SITE_OTHER): Payer: Worker's Compensation | Admitting: Orthopedic Surgery

## 2019-07-26 ENCOUNTER — Other Ambulatory Visit: Payer: Self-pay

## 2019-07-26 VITALS — BP 127/70 | HR 88 | Temp 97.6°F | Ht 74.0 in | Wt 260.0 lb

## 2019-07-26 DIAGNOSIS — M25561 Pain in right knee: Secondary | ICD-10-CM | POA: Diagnosis not present

## 2019-07-26 DIAGNOSIS — G8929 Other chronic pain: Secondary | ICD-10-CM

## 2019-07-26 DIAGNOSIS — S86811A Strain of other muscle(s) and tendon(s) at lower leg level, right leg, initial encounter: Secondary | ICD-10-CM

## 2019-07-26 MED ORDER — TRAMADOL HCL 50 MG PO TABS: 50.0000 mg | ORAL_TABLET | Freq: Four times a day (QID) | ORAL | 5 refills | Status: DC | PRN

## 2019-07-26 NOTE — Progress Notes (Signed)
Chief Complaint  Patient presents with  . Follow-up    Recheck on right knee, ACL repair, DOS 04-10-19.     28 year old male is now 3 months plus after an ACL allograft revision reconstruction of his right knee  He was doing well progressing well in therapy having no major problems had reduced his pain medication and was walking in his living room turned on his right leg heard a loud pop which was audible throughout the house had acute pain in the proximal tibia and difficulty extending his right leg.  He placed his knee brace back on which helped with his ambulation which was difficult and he presents now with pain and swelling over the patellar tendon with weakness decreased range of motion and decreased active extension  Review of Systems  Neurological: Positive for focal weakness. Negative for tingling, tremors and sensory change.   No Known Allergies   Past Medical History:  Diagnosis Date  . Chronic back pain   . Chronic knee pain   . Complication of anesthesia    sts, " I woke up during surgery and sat up".  . History of kidney stones   . Prediabetes   . Renal disorder   . Sciatica    Past Surgical History:  Procedure Laterality Date  . ANTERIOR CRUCIATE LIGAMENT REPAIR Right   . ANTERIOR CRUCIATE LIGAMENT REPAIR Right 04/10/2019   Procedure: RIGHT ANTERIOR CRUCIATE LIGAMENT (ACL) REVISION REPAIR WITH ALLOGRAFT;  Surgeon: Carole Civil, MD;  Location: [facility];  Service: Orthopedics;  Laterality: Right;  . KNEE ARTHROPLASTY Right    broken knee cap      Current Outpatient Medications:  .  escitalopram (LEXAPRO) 10 MG tablet, Take 1 tablet (10 mg total) by mouth every morning., Disp: 30 tablet, Rfl: 5 .  HYDROcodone-acetaminophen (NORCO/VICODIN) 5-325 MG tablet, Take 1 tablet by mouth every 6 (six) hours as needed for moderate pain., Disp: , Rfl:  .  ibuprofen (ADVIL) 800 MG tablet, Take 1 tablet (800 mg total) by mouth every 8 (eight) hours as needed., Disp: 90  tablet, Rfl: 1  Physical Exam Vitals signs and nursing note reviewed.  Constitutional:      Appearance: Normal appearance.  Musculoskeletal:     Comments: Left knee looks good no swelling no tenderness full range of motion no instability normal muscle tone  Right knee well-healed incisions from his ACL reconstruction he has no joint effusion he has some mild medial joint line tenderness but also has what seems to be partial patellar tendon defect tenderness at the patellar tendon just proximal to the tibial tubercle with weak terminal knee extension and weak straight leg raise although he can get the leg off the table  His Lockman test feels normal as does the drawer collateral ligaments are stable  Neurological:     Mental Status: He is alert and oriented to person, place, and time.  Psychiatric:        Mood and Affect: Mood normal.    Imaging x-ray was taken the femoral fixation button is in place the staple is in place the screw is in place the tunnels look good  No effusion  Patellar height looks good although the knee was only flexed about 20 degrees  Presumptive diagnosis partial tear patellar tendon right knee  Recommend MRI  Out of work continue   I will call him after his MRI  He will wear his brace for ambulation and he wants to switch to tramadol for pain  Encounter Diagnoses  Name Primary?  . Right knee pain, unspecified chronicity Yes  . Rupture of right patellar tendon, initial encounter

## 2019-07-26 NOTE — Addendum Note (Signed)
Addended byCandice Camp on: 07/26/2019 12:27 PM   Modules accepted: Orders

## 2019-07-26 NOTE — Telephone Encounter (Signed)
FYI

## 2019-08-02 ENCOUNTER — Ambulatory Visit (HOSPITAL_COMMUNITY)
Admission: RE | Admit: 2019-08-02 | Discharge: 2019-08-02 | Disposition: A | Discharge status: Home / Self Care | Payer: Worker's Compensation | Source: Ambulatory Visit | Attending: Orthopedic Surgery | Admitting: Orthopedic Surgery

## 2019-08-02 ENCOUNTER — Other Ambulatory Visit: Payer: Self-pay

## 2019-08-02 DIAGNOSIS — G8929 Other chronic pain: Secondary | ICD-10-CM

## 2019-08-02 DIAGNOSIS — M25561 Pain in right knee: Secondary | ICD-10-CM | POA: Insufficient documentation

## 2019-08-05 ENCOUNTER — Encounter: Payer: Self-pay | Admitting: Family Medicine

## 2019-08-06 ENCOUNTER — Telehealth: Payer: Self-pay | Admitting: Radiology

## 2019-08-06 ENCOUNTER — Other Ambulatory Visit: Payer: Self-pay | Admitting: *Deleted

## 2019-08-06 MED ORDER — ESCITALOPRAM OXALATE 20 MG PO TABS: 20.0000 mg | ORAL_TABLET | ORAL | 2 refills | Status: DC

## 2019-08-06 NOTE — Telephone Encounter (Signed)
Will you call him with results?  IMPRESSION: Buckling of the ACL graft at the intracondylar notch, however appears intact. There is heterogeneous signal with cystic changes, likely due to graft degeneration and small tibial tunnel ganglion cyst.  Post meniscectomy changes of the posterior lateral meniscus.  Mild chondral disease in the lateral compartment

## 2019-08-06 NOTE — Telephone Encounter (Signed)
TUES TOMORROW I NEED TO SEE TO RECHECK EXAM  MRI UNCLEAR

## 2019-08-06 NOTE — Telephone Encounter (Signed)
Can you call them about the appointment tomorrow?

## 2019-08-06 NOTE — Telephone Encounter (Signed)
Yes, called patient; aware of appointment tomorrow, 08/07/19. Message to worker's comp nurse also.

## 2019-08-07 ENCOUNTER — Encounter: Payer: Self-pay | Admitting: Orthopedic Surgery

## 2019-08-07 ENCOUNTER — Ambulatory Visit (INDEPENDENT_AMBULATORY_CARE_PROVIDER_SITE_OTHER): Payer: Worker's Compensation | Admitting: Orthopedic Surgery

## 2019-08-07 ENCOUNTER — Other Ambulatory Visit: Payer: Self-pay

## 2019-08-07 VITALS — BP 131/86 | HR 72 | Ht 74.0 in | Wt 260.0 lb

## 2019-08-07 DIAGNOSIS — M25562 Pain in left knee: Secondary | ICD-10-CM | POA: Diagnosis not present

## 2019-08-07 DIAGNOSIS — M25561 Pain in right knee: Secondary | ICD-10-CM

## 2019-08-07 DIAGNOSIS — Z9889 Other specified postprocedural states: Secondary | ICD-10-CM | POA: Diagnosis not present

## 2019-08-07 MED ORDER — HYDROCODONE-ACETAMINOPHEN 5-325 MG PO TABS: 1.0000 | ORAL_TABLET | Freq: Four times a day (QID) | ORAL | 0 refills | Status: DC | PRN

## 2019-08-07 NOTE — Progress Notes (Signed)
Progress Note   Patient ID: Cory Davis, male   DOB: Dec 23, 1990, 28 y.o.   MRN: [id number]   Chief Complaint  Patient presents with  . Post-op Follow-up    right ACL   . Knee Pain    right     Encounter Diagnoses  Name Primary?  . S/P ACL repair 04/10/19 recurrent ACL tear, redo repair  Yes  . Acute pain of both knees     28 year old male had ACL allograft reconstruction of her previous ACL repair on April 10, 2019.  He was doing well and then he turned a corner felt a pop acute anterolateral knee pain and presented on September 17 with pain extra-articular swelling located over the anterolateral tibia.  He was sent for MRI.  The MRI showed the graft intact no new meniscal tears and edema near and around the patellar tendon and tibial tunnel  Today after rest tramadol ibuprofen bracing still having anterolateral knee pain    Review of Systems  Musculoskeletal:       Denies mechanical symptoms      BP 131/86   Pulse 72   Ht 6\' 2"  (1.88 m)   Wt 260 lb (117.9 kg)   BMI 33.38 kg/m   Physical Exam Vitals signs and nursing note reviewed.  Constitutional:      Appearance: Normal appearance.  Neurological:     Mental Status: He is alert and oriented to person, place, and time.  Psychiatric:        Mood and Affect: Mood normal.    Right knee is full in extension and flexion he has a trace positive Lockman with firm endpoint as well as trace positive anterior drawer.  He is tender over the anterolateral tibia and tibial insertion of the iliotibial band and just proximal to.  There is no joint effusion.  His gait is supported by a hinged knee brace    Medical decisions:  (Established problem worse, x-ray ,physical therapy, over-the-counter medicines, read outside film or summarize x-ray)  Data  Imaging:   CLINICAL DATA:  Knee pain, locking catching   EXAM: MRI OF THE RIGHT KNEE WITHOUT CONTRAST   TECHNIQUE: Multiplanar, multisequence MR imaging of the knee was  performed. No intravenous contrast was administered.   COMPARISON:  January 11, 2019   FINDINGS: MENISCI   Medial: Intact.   Lateral: There is blunting from prior meniscectomy of the posterior horn of the lateral meniscus.   LIGAMENTS   Cruciates: The patient is status post ACL graft repair since January 11, 2019 where there was a graft rupture. There is overlying susceptibility artifact. The graft appears to be in appropriate position, however appears to be buckled at the intracondylar notch. There is heterogeneous signal seen throughout the graft with small cystic changes at the tibial tunnel and expansion. The PCL is intact.   Collaterals: The MCL is intact. The lateral collateral ligamentous complex is intact.   CARTILAGE   Patellofemoral: Normal.   Medial compartment: Normal.   Lateral compartment: There is mild chondral fissuring seen the weight-bearing surface of lateral femoral condyle with underlying subchondral cystic change.   BONES: Reactive marrow seen adjacent to the graft repair. No avascular necrosis. No pathologic marrow infiltration.   JOINT: No joint effusion. Postsurgical changes seen within Hoffa's fat pad. No plical thickening.   EXTENSOR MECHANISM: Heterogeneous signal seen at the insertion site of the patellar tendon. The retinaculum is unremarkable.   POPLITEAL FOSSA: No popliteal cyst.   OTHER:  The visualized muscles are normal in appearance.   IMPRESSION: Buckling of the ACL graft at the intracondylar notch, however appears intact. There is heterogeneous signal with cystic changes, likely due to graft degeneration and small tibial tunnel ganglion cyst.   Post meniscectomy changes of the posterior lateral meniscus.   Mild chondral disease in the lateral compartment     Electronically Signed   By: Prudencio Pair M.D.   On: 08/02/2019 11:07   I interpret this is intact graft there is bone edema there is edema around the anterolateral  tibia and proximal tibia which probably represents tearing of the iliotibial band insertion  Encounter Diagnoses  Name Primary?  . S/P ACL repair 04/10/19 recurrent ACL tear, redo repair  Yes  . Acute pain of both knees     PLAN:   Ice 3 x a day   Quad exercises   No therapy   Brace   No work   See me in 3 weeks   Take ibuprofen and hydrocodone   Meds ordered this encounter  Medications  . HYDROcodone-acetaminophen (NORCO/VICODIN) 5-325 MG tablet    Sig: Take 1 tablet by mouth every 6 (six) hours as needed for moderate pain.    Dispense:  30 tablet    Refill:  0      Arther Abbott, MD 08/07/2019 2:34 PM

## 2019-08-07 NOTE — Patient Instructions (Signed)
Ice 3 x a day   Quad exercises   No therapy   Brace   No work   See me in 3 weeks   Take ibuprofen and hydrocodone

## 2019-08-14 ENCOUNTER — Other Ambulatory Visit: Payer: Self-pay

## 2019-08-14 DIAGNOSIS — M25561 Pain in right knee: Secondary | ICD-10-CM

## 2019-08-14 DIAGNOSIS — M25562 Pain in left knee: Secondary | ICD-10-CM

## 2019-08-14 DIAGNOSIS — Z9889 Other specified postprocedural states: Secondary | ICD-10-CM

## 2019-08-15 MED ORDER — HYDROCODONE-ACETAMINOPHEN 5-325 MG PO TABS: 1.0000 | ORAL_TABLET | Freq: Four times a day (QID) | ORAL | 0 refills | Status: DC | PRN

## 2019-08-19 ENCOUNTER — Encounter: Payer: Self-pay | Admitting: Family Medicine

## 2019-08-23 ENCOUNTER — Other Ambulatory Visit: Payer: Self-pay

## 2019-08-23 DIAGNOSIS — M25561 Pain in right knee: Secondary | ICD-10-CM

## 2019-08-23 DIAGNOSIS — M25562 Pain in left knee: Secondary | ICD-10-CM

## 2019-08-23 DIAGNOSIS — Z9889 Other specified postprocedural states: Secondary | ICD-10-CM

## 2019-08-23 MED ORDER — HYDROCODONE-ACETAMINOPHEN 5-325 MG PO TABS: 1.0000 | ORAL_TABLET | Freq: Four times a day (QID) | ORAL | 0 refills | Status: DC | PRN

## 2019-08-27 ENCOUNTER — Encounter: Payer: Self-pay | Admitting: Orthopedic Surgery

## 2019-08-27 ENCOUNTER — Ambulatory Visit (INDEPENDENT_AMBULATORY_CARE_PROVIDER_SITE_OTHER): Payer: Worker's Compensation | Admitting: Orthopedic Surgery

## 2019-08-27 ENCOUNTER — Other Ambulatory Visit: Payer: Self-pay

## 2019-08-27 VITALS — BP 123/83 | HR 91 | Temp 97.8°F | Ht 74.0 in | Wt 260.0 lb

## 2019-08-27 DIAGNOSIS — Z9889 Other specified postprocedural states: Secondary | ICD-10-CM | POA: Diagnosis not present

## 2019-08-27 NOTE — Progress Notes (Signed)
Chief Complaint  Patient presents with  . Follow-up    3 week recheck on right knee, DOS 04-10-19.   9/17 loud pop in the knee   MRI 9/24 no evidence of graft rupture   Greig continues to struggle comes in today with most of his pain in terms of complaints at the patellar tendon also pain at the iliotibial band  I asked him to walk without his brace which he did he has a limp he says his knee hyperextends but when he is walking he keeps it in slight flexion ginger weightbearing  His graft again feels good and 90 degrees and 30 degrees flexion his patellar tendon is intact he still tender in his ITB and inferior pole of patella  His range of motion has been maintained he has good extension no extensor lag  He has weakness in extension however  System review nothing new to add on the musculoskeletal system.  Seems a little down and depressed  Recommend physical therapy with eccentric quadriceps exercises for patellar tendinitis  Out of work for 4 weeks  Turn 4 weeks

## 2019-08-27 NOTE — Patient Instructions (Signed)
Ice   Ibuprofen 800 mg tid   norco   Start PT for patella tendonitis   OOW x 4 weeks   Brace for ambulating

## 2019-08-28 ENCOUNTER — Telehealth: Payer: Self-pay | Admitting: Radiology

## 2019-08-28 NOTE — Telephone Encounter (Addendum)
Physical therapy needs [place] to send in dexamethasone for the iontophoresis, I am having trouble finding this in the computer. I wrote a hand written RX / if you sign I will fax to [facility]

## 2019-08-29 NOTE — Telephone Encounter (Signed)
Yes. I called to discuss

## 2019-08-29 NOTE — Telephone Encounter (Signed)
Call received from pharmacist Tina, [employer] - need instructions and need to verify if this is the injectable medicine ph#[phone]/Fax# [phone]

## 2019-09-03 ENCOUNTER — Telehealth: Payer: Self-pay | Admitting: Radiology

## 2019-09-03 ENCOUNTER — Other Ambulatory Visit: Payer: Self-pay

## 2019-09-03 DIAGNOSIS — M25561 Pain in right knee: Secondary | ICD-10-CM

## 2019-09-03 DIAGNOSIS — Z9889 Other specified postprocedural states: Secondary | ICD-10-CM

## 2019-09-03 NOTE — Telephone Encounter (Signed)
I left message for him to call me back. He sent my chart message for me to call him.

## 2019-09-03 NOTE — Telephone Encounter (Signed)
Patient returned call; aware he missed your call. Michela Pitcher it is in regard to the medication which has been discussed.

## 2019-09-04 MED ORDER — HYDROCODONE-ACETAMINOPHEN 5-325 MG PO TABS: 1.0000 | ORAL_TABLET | Freq: Four times a day (QID) | ORAL | 0 refills | Status: DC | PRN

## 2019-09-04 NOTE — Telephone Encounter (Signed)
Ok cancell that part

## 2019-09-04 NOTE — Telephone Encounter (Signed)
Pharmacy can not get the gel for the iontophoresis, and it can not be ordered  He can not have the iontophoresis without it, any suggestions?

## 2019-09-04 NOTE — Telephone Encounter (Signed)
Okay, I have advised him and have also advised physical therapy to cancel the iontophoresis.

## 2019-09-05 ENCOUNTER — Ambulatory Visit: Payer: Worker's Compensation | Admitting: Orthopedic Surgery

## 2019-09-13 ENCOUNTER — Other Ambulatory Visit: Payer: Self-pay

## 2019-09-13 DIAGNOSIS — M25561 Pain in right knee: Secondary | ICD-10-CM

## 2019-09-13 DIAGNOSIS — Z9889 Other specified postprocedural states: Secondary | ICD-10-CM

## 2019-09-17 MED ORDER — HYDROCODONE-ACETAMINOPHEN 5-325 MG PO TABS: 1.0000 | ORAL_TABLET | Freq: Three times a day (TID) | ORAL | 0 refills | Status: AC | PRN

## 2019-09-20 ENCOUNTER — Other Ambulatory Visit: Payer: Self-pay | Admitting: Orthopedic Surgery

## 2019-09-20 ENCOUNTER — Telehealth: Payer: Self-pay | Admitting: Orthopedic Surgery

## 2019-09-20 DIAGNOSIS — M25561 Pain in right knee: Secondary | ICD-10-CM

## 2019-09-20 MED ORDER — TRAMADOL HCL 50 MG PO TABS: 50.0000 mg | ORAL_TABLET | Freq: Four times a day (QID) | ORAL | 5 refills | Status: DC | PRN

## 2019-09-20 NOTE — Telephone Encounter (Signed)
Call now received from [place] at [facility] [place], [phone] stating that patient called to request that his Tramadol prescription be filled early, due to having to go out of state to a funeral. Relayed Dr Aline Brochure is in surgery and out of clinic today, and he would need to approve early refill. Also mentioned that if Dr approves, and patient has already left, prescription can be transferred.

## 2019-09-20 NOTE — Telephone Encounter (Signed)
Patient requests to speak with Amy, regarding medication,

## 2019-09-20 NOTE — Progress Notes (Signed)
Approved.  

## 2019-09-24 ENCOUNTER — Ambulatory Visit: Payer: Worker's Compensation | Admitting: Orthopedic Surgery

## 2019-09-26 ENCOUNTER — Other Ambulatory Visit: Payer: Self-pay | Admitting: Orthopedic Surgery

## 2019-09-26 DIAGNOSIS — M25561 Pain in right knee: Secondary | ICD-10-CM

## 2019-09-26 MED ORDER — HYDROCODONE-ACETAMINOPHEN 5-325 MG PO TABS: 1.0000 | ORAL_TABLET | Freq: Four times a day (QID) | ORAL | 0 refills | Status: DC | PRN

## 2019-09-26 MED ORDER — HYDROCODONE-ACETAMINOPHEN 5-325 MG PO TABS: 1.0000 | ORAL_TABLET | Freq: Two times a day (BID) | ORAL | 0 refills | Status: DC | PRN

## 2019-10-02 ENCOUNTER — Other Ambulatory Visit: Payer: Self-pay

## 2019-10-02 ENCOUNTER — Ambulatory Visit (INDEPENDENT_AMBULATORY_CARE_PROVIDER_SITE_OTHER): Payer: Worker's Compensation | Admitting: Orthopedic Surgery

## 2019-10-02 ENCOUNTER — Encounter: Payer: Self-pay | Admitting: Orthopedic Surgery

## 2019-10-02 VITALS — BP 123/86 | HR 82 | Temp 98.4°F | Ht 74.0 in | Wt 260.0 lb

## 2019-10-02 DIAGNOSIS — Z9889 Other specified postprocedural states: Secondary | ICD-10-CM | POA: Diagnosis not present

## 2019-10-02 DIAGNOSIS — S83521D Sprain of posterior cruciate ligament of right knee, subsequent encounter: Secondary | ICD-10-CM

## 2019-10-02 MED ORDER — PREDNISONE 10 MG (48) PO TBPK: ORAL_TABLET | Freq: Every day | ORAL | 0 refills | Status: DC

## 2019-10-02 MED ORDER — HYDROCODONE-ACETAMINOPHEN 5-325 MG PO TABS: 1.0000 | ORAL_TABLET | Freq: Four times a day (QID) | ORAL | 0 refills | Status: DC | PRN

## 2019-10-02 NOTE — Progress Notes (Signed)
Chief Complaint  Patient presents with  . Post-op Follow-up    4 week recheck on right ACL repair, DOS 04-10-19.    5 months after reconstruction ACL right knee complicated by reinjury with severe anterior knee pain which is improved slightly but patient is still having trouble walking and getting the knee into full extension he feels like the knee is hyperextending however clinically and when walking the knee never goes into extension  ROS negative for MSK    We were able to get an extension with passive maneuver  Recommend continue bracing.  Since we did not get dexamethasone topical or going order prednisone Dosepak 10 mg taper 12 days follow-up in 2 weeks  Refill hydrocodone takes 3/day and also takes tramadol  Continue attempts at weaning opioid  Follow-up in 2 weeks

## 2019-10-02 NOTE — Patient Instructions (Signed)
continue bracing   Prone hangs   Therapy   No work yet

## 2019-10-10 ENCOUNTER — Other Ambulatory Visit: Payer: Self-pay

## 2019-10-10 DIAGNOSIS — Z9889 Other specified postprocedural states: Secondary | ICD-10-CM

## 2019-10-10 MED ORDER — HYDROCODONE-ACETAMINOPHEN 5-325 MG PO TABS: 1.0000 | ORAL_TABLET | Freq: Four times a day (QID) | ORAL | 0 refills | Status: DC | PRN

## 2019-10-12 ENCOUNTER — Other Ambulatory Visit: Payer: Self-pay | Admitting: Orthopedic Surgery

## 2019-10-12 DIAGNOSIS — M25561 Pain in right knee: Secondary | ICD-10-CM

## 2019-10-12 DIAGNOSIS — Z9889 Other specified postprocedural states: Secondary | ICD-10-CM

## 2019-10-12 MED ORDER — PREDNISONE 10 MG (48) PO TBPK: ORAL_TABLET | Freq: Every day | ORAL | 0 refills | Status: DC

## 2019-10-12 NOTE — Telephone Encounter (Signed)
Patient requests refills on medications: (relayed protocol of requesting by noon on Thursdays)  (1)  traMADol (ULTRAM) 50 MG tablet 60 tablet 5 09/20/2019    Sig - Route: Take 1 tablet (50 mg total) by mouth every 6 (six) hours as needed. - Oral   (2)  ibuprofen (ADVIL) 800 MG tablet 90 tablet  (Both at) [facility] in [place]

## 2019-10-18 ENCOUNTER — Other Ambulatory Visit: Payer: Self-pay | Admitting: Orthopedic Surgery

## 2019-10-18 DIAGNOSIS — Z9889 Other specified postprocedural states: Secondary | ICD-10-CM

## 2019-10-26 ENCOUNTER — Ambulatory Visit
Admission: RE | Admit: 2019-10-26 | Discharge: 2019-10-26 | Disposition: A | Discharge status: Home / Self Care | Payer: Worker's Compensation | Source: Ambulatory Visit | Attending: Orthopedic Surgery | Admitting: Orthopedic Surgery

## 2019-10-26 DIAGNOSIS — Z9889 Other specified postprocedural states: Secondary | ICD-10-CM

## 2019-10-31 ENCOUNTER — Ambulatory Visit: Payer: PRIVATE HEALTH INSURANCE | Admitting: Orthopedic Surgery

## 2019-11-21 ENCOUNTER — Other Ambulatory Visit: Payer: Self-pay | Admitting: Family Medicine

## 2019-11-22 NOTE — Telephone Encounter (Signed)
Three mo worth 

## 2019-11-22 NOTE — Telephone Encounter (Signed)
Pt has med check 1/20

## 2019-11-28 ENCOUNTER — Ambulatory Visit: Payer: PRIVATE HEALTH INSURANCE | Admitting: Family Medicine

## 2019-11-28 ENCOUNTER — Other Ambulatory Visit: Payer: Self-pay

## 2019-12-10 ENCOUNTER — Encounter: Payer: Self-pay | Admitting: Family Medicine

## 2019-12-25 ENCOUNTER — Encounter: Payer: Self-pay | Admitting: Family Medicine

## 2019-12-25 NOTE — Telephone Encounter (Signed)
Called pt and he states he is in process of moving and has forgot to take lexapro 20mg  for the past 10 -14 days. States he wants to start back and wants to know if he can just start back on the 20mg . He has not had any thoughts of hurting himself but states he can tell he is depressed and aggitated not taking it. Last seen for depression august 2020

## 2020-02-22 ENCOUNTER — Encounter: Payer: Self-pay | Admitting: Family Medicine

## 2020-03-05 ENCOUNTER — Other Ambulatory Visit (HOSPITAL_COMMUNITY): Payer: Self-pay | Admitting: Ophthalmology

## 2020-03-05 ENCOUNTER — Other Ambulatory Visit: Payer: Self-pay

## 2020-03-05 ENCOUNTER — Emergency Department (HOSPITAL_COMMUNITY)
Admission: EM | Admit: 2020-03-05 | Discharge: 2020-03-05 | Disposition: A | Discharge status: Home / Self Care | Payer: Worker's Compensation | Attending: Emergency Medicine | Admitting: Emergency Medicine

## 2020-03-05 ENCOUNTER — Ambulatory Visit (HOSPITAL_BASED_OUTPATIENT_CLINIC_OR_DEPARTMENT_OTHER)
Admission: RE | Admit: 2020-03-05 | Discharge: 2020-03-05 | Disposition: A | Discharge status: Home / Self Care | Payer: Worker's Compensation | Source: Ambulatory Visit | Attending: Ophthalmology | Admitting: Ophthalmology

## 2020-03-05 ENCOUNTER — Encounter (HOSPITAL_COMMUNITY): Payer: Self-pay | Admitting: Emergency Medicine

## 2020-03-05 DIAGNOSIS — M79661 Pain in right lower leg: Secondary | ICD-10-CM | POA: Diagnosis present

## 2020-03-05 DIAGNOSIS — F17210 Nicotine dependence, cigarettes, uncomplicated: Secondary | ICD-10-CM | POA: Insufficient documentation

## 2020-03-05 DIAGNOSIS — I82451 Acute embolism and thrombosis of right peroneal vein: Secondary | ICD-10-CM

## 2020-03-05 DIAGNOSIS — M7989 Other specified soft tissue disorders: Secondary | ICD-10-CM | POA: Insufficient documentation

## 2020-03-05 DIAGNOSIS — Z96651 Presence of right artificial knee joint: Secondary | ICD-10-CM | POA: Insufficient documentation

## 2020-03-05 DIAGNOSIS — I82461 Acute embolism and thrombosis of right calf muscular vein: Secondary | ICD-10-CM | POA: Insufficient documentation

## 2020-03-05 DIAGNOSIS — Z79899 Other long term (current) drug therapy: Secondary | ICD-10-CM | POA: Diagnosis not present

## 2020-03-05 DIAGNOSIS — M79604 Pain in right leg: Secondary | ICD-10-CM

## 2020-03-05 LAB — BASIC METABOLIC PANEL
Anion gap: 6 (ref 5–15)
BUN: 8 mg/dL (ref 6–20)
CO2: 24 mmol/L (ref 22–32)
Calcium: 9.1 mg/dL (ref 8.9–10.3)
Chloride: 105 mmol/L (ref 98–111)
Creatinine, Ser: 0.83 mg/dL (ref 0.61–1.24)
GFR calc Af Amer: >60 mL/min (ref >60)
GFR calc non Af Amer: >60 mL/min (ref >60)
Glucose, Bld: 97 mg/dL (ref 70–99)
Potassium: 3.9 mmol/L (ref 3.5–5.1)
Sodium: 135 mmol/L (ref 135–145)

## 2020-03-05 LAB — CBC
HCT: 38.6 % — ABNORMAL LOW (ref 39.0–52.0)
Hemoglobin: 12.8 g/dL — ABNORMAL LOW (ref 13.0–17.0)
MCH: 28.3 pg (ref 26.0–34.0)
MCHC: 33.2 g/dL (ref 30.0–36.0)
MCV: 85.4 fL (ref 80.0–100.0)
Platelets: 328 10*3/uL (ref 150–400)
RBC: 4.52 MIL/uL (ref 4.22–5.81)
RDW: 13.5 % (ref 11.5–15.5)
WBC: 12.1 10*3/uL — ABNORMAL HIGH (ref 4.0–10.5)
nRBC: 0 % (ref 0.0–0.2)

## 2020-03-05 MED ORDER — RIVAROXABAN (XARELTO) VTE STARTER PACK (15 & 20 MG): 15.0000 mg | ORAL_TABLET | Freq: Two times a day (BID) | ORAL | 0 refills | Status: DC

## 2020-03-05 MED ORDER — RIVAROXABAN 15 MG PO TABS
15.0000 mg | ORAL_TABLET | Freq: Once | ORAL | Status: AC
  Administered 2020-03-05: 15 mg via ORAL
  Filled 2020-03-05: qty 1

## 2020-03-05 MED ORDER — RIVAROXABAN (XARELTO) EDUCATION KIT FOR DVT/PE PATIENTS
PACK | Freq: Once | Status: AC
  Filled 2020-03-05: qty 1

## 2020-03-05 NOTE — ED Provider Notes (Signed)
[facility] DEPT Provider Note   CSN: [id number] Arrival date & time: 03/05/20  1101     History Chief Complaint  Patient presents with  . Leg Pain    Cory Davis is a 29 y.o. male with PMH significant for right ACL revision surgery performed 04/10/2019 and bone graft procedure performed 02/28/2020 who presents to the ED after outpatient vascular scan performed today revealed findings consistent with acute DVT involving right peroneal veins and right gastrocnemius veins.  Patient is followed by his orthopedist, Dr. Theda Sers, who has already been made aware of today's findings.  Patient states that his pain is controlled when he has his leg held parallel to the ground, however he experiences significant discomfort when it is at all lowered.  He is already taking muscle relaxants and oxycodone at home, as prescribed by his orthopedist.  He is not requesting any pain medication here in the ED today.  He denies any recent illness, fevers or chills, chest pain, shortness of breath, pleuritic chest discomfort or pleuritic cough, abdominal or back pain, hip or pelvic discomfort, thigh pain, history of bleeding disorder, numbness or weakness, or other symptoms.  HPI     Past Medical History:  Diagnosis Date  . Chronic back pain   . Chronic knee pain   . Complication of anesthesia    sts, " I woke up during surgery and sat up".  . History of kidney stones   . Prediabetes   . Renal disorder   . Sciatica     Patient Active Problem List   Diagnosis Date Noted  . S/P ACL repair 04/10/19 recurrent ACL tear, redo repair  04/16/2019  . Rupture of anterior cruciate ligament of right knee   . PTSD (post-traumatic stress disorder) 03/09/2018  . MDD (major depressive disorder), recurrent episode, moderate ([place]) 03/09/2018  . Gastroesophageal reflux disease without esophagitis 10/12/2017  . TEAR LATERAL MENISCUS 01/10/2008  . L C L SPRAIN 01/10/2008  . M C L SPRAIN  01/10/2008  . TEAR A C L 01/10/2008    Past Surgical History:  Procedure Laterality Date  . ANTERIOR CRUCIATE LIGAMENT REPAIR Right   . ANTERIOR CRUCIATE LIGAMENT REPAIR Right 04/10/2019   Procedure: RIGHT ANTERIOR CRUCIATE LIGAMENT (ACL) REVISION REPAIR WITH ALLOGRAFT;  Surgeon: Carole Civil, MD;  Location: [facility];  Service: Orthopedics;  Laterality: Right;  . KNEE ARTHROPLASTY Right    broken knee cap       Family History  Problem Relation Age of Onset  . Diabetes Other   . Diabetes Mother   . Hypertension Father   . Cancer Sister   . Diabetes Maternal Grandfather   . Hypertension Maternal Grandfather     Social History   Tobacco Use  . Smoking status: Current Every Day Smoker    Packs/day: 0.50    Years: 10.00    Pack years: 5.00    Types: Cigarettes  . Smokeless tobacco: Former Network engineer Use Topics  . Alcohol use: No    Comment: rare  . Drug use: No    Home Medications Prior to Admission medications   Medication Sig Start Date End Date Taking? Authorizing Provider  escitalopram (LEXAPRO) 20 MG tablet TAKE 1 TABLET BY MOUTH ONCE DAILY IN THE MORNING. CANCEL ESCITALOPRAM 10 MG 11/22/19   Mikey Kirschner, MD  HYDROcodone-acetaminophen (NORCO/VICODIN) 5-325 MG tablet Take 1 tablet by mouth every 6 (six) hours as needed for moderate pain. 10/10/19   Carole Civil,  MD  ibuprofen (ADVIL) 800 MG tablet Take 1 tablet (800 mg total) by mouth every 8 (eight) hours as needed. 04/10/19   Carole Civil, MD  predniSONE (STERAPRED UNI-PAK 48 TAB) 10 MG (48) TBPK tablet Take by mouth daily. 10/12/19   Carole Civil, MD  traMADol (ULTRAM) 50 MG tablet TAKE 1 TABLET BY MOUTH EVERY 6 HOURS AS NEEDED 10/15/19   Carole Civil, MD    Allergies    Patient has no known allergies.  Review of Systems   Review of Systems  All other systems reviewed and are negative.   Physical Exam Updated Vital Signs BP 136/85   Pulse 90   Temp 98.2 F (36.8 C)  (Oral)   Resp 18   SpO2 97%   Physical Exam Vitals and nursing note reviewed. Exam conducted with a chaperone present.  Constitutional:      Appearance: Normal appearance.  HENT:     Head: Normocephalic and atraumatic.  Eyes:     General: No scleral icterus.    Conjunctiva/sclera: Conjunctivae normal.  Cardiovascular:     Rate and Rhythm: Normal rate and regular rhythm.     Pulses: Normal pulses.     Heart sounds: Normal heart sounds.  Pulmonary:     Effort: Pulmonary effort is normal. No respiratory distress.     Breath sounds: Normal breath sounds. No wheezing or rales.  Musculoskeletal:     Comments: Right leg: Evidence of recent surgery.  Bandaged and no evidence of spreading erythema or induration concerning for a cellulitis.  Swelling involving the knee and calf.  Older ecchymoses, presumably from the surgery.  Calf tenderness to palpation.  No tenderness elicited in the quadriceps/hamstring area.  Positive Homans' sign.  Pedal pulse intact.  Can wiggle toes and flex/dorsiflex at ankle against resistance, albeit with some discomfort.  Sensation intact throughout.  Skin:    General: Skin is dry.     Capillary Refill: Capillary refill takes less than 2 seconds.  Neurological:     Mental Status: He is alert and oriented to person, place, and time.     GCS: GCS eye subscore is 4. GCS verbal subscore is 5. GCS motor subscore is 6.  Psychiatric:        Mood and Affect: Mood normal.        Behavior: Behavior normal.        Thought Content: Thought content normal.        ED Results / Procedures / Treatments   Labs (all labs ordered are listed, but only abnormal results are displayed) Labs Reviewed  CBC - Abnormal; Notable for the following components:      Result Value   WBC 12.1 (*)    Hemoglobin 12.8 (*)    HCT 38.6 (*)    All other components within normal limits  BASIC METABOLIC PANEL    EKG None  Radiology VAS [place] LOWER EXTREMITY VENOUS (DVT)  Result Date:  03/05/2020  Lower Venous DVTStudy Indications: Pain, Swelling, and 02/28/2020 right knee bone graft.  Comparison Study: No prior study Performing Technologist: Maudry Mayhew MHA, RDMS, RVT, RDCS  Examination Guidelines: A complete evaluation includes B-mode imaging, spectral Doppler, color Doppler, and power Doppler as needed of all accessible portions of each vessel. Bilateral testing is considered an integral part of a complete examination. Limited examinations for reoccurring indications may be performed as noted. The reflux portion of the exam is performed with the patient in reverse Trendelenburg.  +---------+---------------+---------+-----------+----------+--------------+ RIGHT  CompressibilityPhasicitySpontaneityPropertiesThrombus Aging +---------+---------------+---------+-----------+----------+--------------+ CFV      Full           Yes      Yes                                 +---------+---------------+---------+-----------+----------+--------------+ SFJ      Full                                                        +---------+---------------+---------+-----------+----------+--------------+ FV Prox  Full                                                        +---------+---------------+---------+-----------+----------+--------------+ FV Mid   Full                                                        +---------+---------------+---------+-----------+----------+--------------+ FV DistalFull                                                        +---------+---------------+---------+-----------+----------+--------------+ PFV      Full                                                        +---------+---------------+---------+-----------+----------+--------------+ POP      Full           Yes      Yes                                 +---------+---------------+---------+-----------+----------+--------------+ PTV      Full                                                         +---------+---------------+---------+-----------+----------+--------------+ PERO     None                    No                   Acute          +---------+---------------+---------+-----------+----------+--------------+ Gastroc  None                    No                   Acute          +---------+---------------+---------+-----------+----------+--------------+   +----+---------------+---------+-----------+----------+--------------+ LEFTCompressibilityPhasicitySpontaneityPropertiesThrombus Aging +----+---------------+---------+-----------+----------+--------------+ CFV  Full           Yes      Yes                                 +----+---------------+---------+-----------+----------+--------------+     Summary: RIGHT: - Findings consistent with acute deep vein thrombosis involving the right peroneal veins, and right gastrocnemius veins. - No cystic structure found in the popliteal fossa.  LEFT: - No evidence of common femoral vein obstruction.  *See table(s) above for measurements and observations.    Preliminary     Procedures .Critical Care Performed by: Corena Herter, PA-C Authorized by: Corena Herter, PA-C   Critical care provider statement:    Critical care time (minutes):  45   Critical care was necessary to treat or prevent imminent or life-threatening deterioration of the following conditions:  Circulatory failure   Critical care was time spent personally by me on the following activities:  Discussions with consultants, evaluation of patient's response to treatment, examination of patient, ordering and performing treatments and interventions, ordering and review of laboratory studies, ordering and review of radiographic studies, pulse oximetry, re-evaluation of patient's condition, obtaining history from patient or surrogate and review of old charts   (including critical care time)  Medications Ordered in ED Medications   Rivaroxaban (XARELTO) tablet 15 mg (15 mg Oral Given 03/05/20 1212)  rivaroxaban Alveda Reasons) Education Kit for DVT/PE patients ( Does not apply Given by Other 03/05/20 1208)    ED Course  I have reviewed the triage vital signs and the nursing notes.  Pertinent labs & imaging results that were available during my care of the patient were reviewed by me and considered in my medical decision making (see chart for details).    MDM Rules/Calculators/A&P                      Patient's history, physical exam, and recent DVT study is consistent with acute DVT involving right peroneal veins and right calf anemias veins.  No proximal vein involvement.  Low suspicion for phlegmasia cerulea dolens.  Patient's pain symptoms are relatively well controlled and he is declining any analgesia here in the ER.  He denies any history of bleeding disorder.  No chest discomfort, shortness of breath, or pleuritic cough/pleuritic chest discomfort was concerning for pulmonary embolism.  His vital signs are stable and within normal limits.  He is resting comfortably and does not appear to be in any acute distress.  Obtained basic laboratory work-up which demonstrates normal renal function and is otherwise reassuring.  Patient has mild leukocytosis to 12.1 as well as mild anemia to 12.8, both of which may be partially attributable to his recent surgery.  Patient denies any lightheadedness, headache, chest pain or SOB, or evidence of infection.   Arranged for pharmacy consult and will place patient on Xarelto for his DVT.  Encouraging him to follow-up with his orthopedist regarding today's encounter and for continued outpatient analgesics.  He states that he was recently prescribed oxycodone in addition to a muscle relaxant and does not require any further medications at this time.    Strict ED return precautions discussed with patient.  All of the evaluation and work-up results were discussed with the patient and any family at  bedside. They were provided opportunity to ask any additional questions and have none at this time. They have expressed understanding of verbal discharge instructions  as well as return precautions and are agreeable to the plan.    Final Clinical Impression(s) / ED Diagnoses Final diagnoses:  Acute deep vein thrombosis (DVT) of right peroneal vein [facility])    Rx / DC Orders ED Discharge Orders    None       Corena Herter, PA-C 03/05/20 1246    Lucrezia Starch, MD 03/08/20 1257

## 2020-03-05 NOTE — ED Triage Notes (Signed)
Pt reports that he recently had bone graph done on right leg recently and had severe pains. Reports hx blood clots, reports had vascular scan done today here at [facility] and was told he had a clot and brought to ED. Not currently on any blood thinners.

## 2020-03-05 NOTE — ED Notes (Signed)
Pharmacy at bedside

## 2020-03-05 NOTE — Discharge Instructions (Addendum)
Do not take aspirin, ibuprofen, or other NSAIDs while taking Xarelto as it will increase your bleeding risk.    Continue take your the pain medications, as directed.  Please notify your primary care provider regarding today's encounter.  I would also like for you to follow-up with your orthopedist for ongoing evaluation and management.  You will need to have a repeat ultrasound performed at some point in the future.  Please return to the ED or seek immediate medical attention should you experience any new or worsening symptoms.

## 2020-03-05 NOTE — Progress Notes (Signed)
ANTICOAGULATION CONSULT NOTE - Initial Consult  Pharmacy Consult for rivaroxaban Indication: DVT  No Known Allergies  Patient Measurements:   Heparin Dosing Weight:   Vital Signs: Temp: 98.2 F (36.8 C) (04/28 1121) Temp Source: Oral (04/28 1121) BP: 136/85 (04/28 1121) Pulse Rate: 90 (04/28 1121)  Labs: No results for input(s): HGB, HCT, PLT, APTT, LABPROT, INR, HEPARINUNFRC, HEPRLOWMOCWT, CREATININE, CKTOTAL, CKMB, TROPONINIHS in the last 72 hours.  CrCl cannot be calculated (Patient's most recent lab result is older than the maximum 21 days allowed.).   Medical History: Past Medical History:  Diagnosis Date  . Chronic back pain   . Chronic knee pain   . Complication of anesthesia    sts, " I woke up during surgery and sat up".  . History of kidney stones   . Prediabetes   . Renal disorder   . Sciatica     Medications:  Scheduled:   Assessment: Pharmacy is consulted to dose rivaroxaban in 29 yo male diagnosed with DVT. Pt with recent bone graft procedure on 4/22.    Today, 03/05/20  Hgb 12.8, plt 328  SCr 0.83 mg/dl, Crcl > 100 ml/min  Goal of Therapy:  Monitor platelets by anticoagulation protocol: Yes   Plan:   Xarelto 15 mg PO BID with meals x 21 days, then Xarelto 20 mg daily with a meal   Monitor renal function, CBC as available  Monitor for signs and symptoms of bleeding  Pt counseled on medication and provided with coupon cards and DVT kit provided by drug company    Royetta Asal, PharmD, BCPS 03/05/2020 12:18 PM

## 2020-03-05 NOTE — Progress Notes (Signed)
Right lower extremity venous duplex completed. Refer to "CV Proc" under chart review to view preliminary results.  03/05/2020 10:34 AM Eula Fried., MHA, RVT, RDCS, RDMS

## 2020-03-20 ENCOUNTER — Telehealth (INDEPENDENT_AMBULATORY_CARE_PROVIDER_SITE_OTHER): Payer: PRIVATE HEALTH INSURANCE | Admitting: Family Medicine

## 2020-03-20 ENCOUNTER — Telehealth: Payer: Self-pay | Admitting: Family Medicine

## 2020-03-20 ENCOUNTER — Other Ambulatory Visit: Payer: Self-pay

## 2020-03-20 DIAGNOSIS — F4310 Post-traumatic stress disorder, unspecified: Secondary | ICD-10-CM

## 2020-03-20 DIAGNOSIS — I82409 Acute embolism and thrombosis of unspecified deep veins of unspecified lower extremity: Secondary | ICD-10-CM | POA: Diagnosis not present

## 2020-03-20 DIAGNOSIS — R454 Irritability and anger: Secondary | ICD-10-CM

## 2020-03-20 DIAGNOSIS — F411 Generalized anxiety disorder: Secondary | ICD-10-CM | POA: Diagnosis not present

## 2020-03-20 MED ORDER — ESCITALOPRAM OXALATE 20 MG PO TABS: ORAL_TABLET | ORAL | 5 refills | Status: DC

## 2020-03-20 MED ORDER — RIVAROXABAN 20 MG PO TABS: ORAL_TABLET | ORAL | 1 refills | Status: DC

## 2020-03-20 NOTE — Telephone Encounter (Signed)
[name], hounshell are scheduled for a virtual visit with your provider today.    Just as we do with appointments in the office, we must obtain your consent to participate.  Your consent will be active for this visit and any virtual visit you may have with one of our providers in the next 365 days.    If you have a MyChart account, I can also send a copy of this consent to you electronically.  All virtual visits are billed to your insurance company just like a traditional visit in the office.  As this is a virtual visit, video technology does not allow for your provider to perform a traditional examination.  This may limit your provider's ability to fully assess your condition.  If your provider identifies any concerns that need to be evaluated in person or the need to arrange testing such as labs, EKG, etc, we will make arrangements to do so.    Although advances in technology are sophisticated, we cannot ensure that it will always work on either your end or our end.  If the connection with a video visit is poor, we may have to switch to a telephone visit.  With either a video or telephone visit, we are not always able to ensure that we have a secure connection.   I need to obtain your verbal consent now.   Are you willing to proceed with your visit today?   Cory Davis has provided verbal consent on 03/20/2020 for a virtual visit (video or telephone).   [name], LPN 4/82/7078  6:75 AM

## 2020-03-20 NOTE — Progress Notes (Signed)
   Subjective:  Audio video  Patient ID: Cory Davis, male    DOB: 1991/07/20, 29 y.o.   MRN: [id number]  HPI Pt recently had blood clot in right leg. Pt went to ED on 03/05/20 for leg pain. Pt had knee surgery on 02/28/20. Pt states he is not having any issues at the moment.   Virtual Visit via Telephone Note  I connected with Cory Davis on 03/20/20 at  8:40 AM EDT by telephone and verified that I am speaking with the correct person using two identifiers.  Location: Patient: home Provider: office   I discussed the limitations, risks, security and privacy concerns of performing an evaluation and management service by telephone and the availability of in person appointments. I also discussed with the patient that there may be a patient responsible charge related to this service. The patient expressed understanding and agreed to proceed.   History of Present Illness:    Observations/Objective:   Assessment and Plan:   Follow Up Instructions:    I discussed the assessment and treatment plan with the patient. The patient was provided an opportunity to ask questions and all were answered. The patient agreed with the plan and demonstrated an understanding of the instructions.   The patient was advised to call back or seek an in-person evaluation if the symptoms worsen or if the condition fails to improve as anticipated.  I provided 20 minutes of non-face-to-face time during this encounter.  Prior ER notes reviewed  Patient developed DVT soon after knee surgery.  Started on Xarelto appropriately.  Tolerating it well.  Compliant.  No obvious side effects    Review of Systems No headache no chest pain no shortness of breath    Objective:   Physical Exam  Virtual      Assessment & Plan:  Impression 1 DVT.  Patient advised to take 3 months of complete therapy due to orthopedic etiology.  No history of prior DVTs  2.  History of PTSD/anxiety/element of depression.   Patient states overall much improved.  Would like a bit stronger dose of Lexapro if possible.  Discussed maximum FDA dose is 20, but 30 is used on occasion and with effectiveness.  Will increase.  Insurance may or may not pay discussed  Follow-up 6 months

## 2020-03-31 ENCOUNTER — Ambulatory Visit: Payer: PRIVATE HEALTH INSURANCE | Admitting: Family Medicine

## 2020-04-01 ENCOUNTER — Encounter: Payer: Self-pay | Admitting: Family Medicine

## 2020-04-01 ENCOUNTER — Other Ambulatory Visit: Payer: Self-pay

## 2020-04-01 ENCOUNTER — Ambulatory Visit (INDEPENDENT_AMBULATORY_CARE_PROVIDER_SITE_OTHER): Payer: PRIVATE HEALTH INSURANCE | Admitting: Family Medicine

## 2020-04-01 VITALS — BP 118/80 | Temp 97.6°F

## 2020-04-01 DIAGNOSIS — K921 Melena: Secondary | ICD-10-CM | POA: Diagnosis not present

## 2020-04-01 LAB — POCT HEMOGLOBIN: Hemoglobin: 15 g/dL — AB (ref 11–14.6)

## 2020-04-01 NOTE — Progress Notes (Signed)
   Subjective:    Patient ID: Cory Davis, male    DOB: [birth year], 29 y.o.   MRN: [id number]  HPI Patient comes in today with concerns of blood in stools x 1 week. Patient states it occurs with every bowel movement and it is quite a bit of blood with small clots. Patient taking 20mg  xarelto daily.  Results for orders placed or performed in visit on 04/01/20  POCT hemoglobin  Result Value Ref Range   Hemoglobin 15.0 (A) 11 - 14.6 g/dL     Review of Systems .rs No headache, no major weight loss or weight gain, no chest pain no back pain abdominal pain no change in bowel habits complete ROS otherwise negative     Objective:   Physical Exam  Alert vitals stable, NAD. Blood pressure good on repeat. HEENT normal. Lungs clear. Heart regular rate and rhythm. Rectal exam pos ext hem, stool obtained shows pos hemocult      Assessment & Plan:  Dragon broke. Imp rectal bleeding w pos heme stool. On xarelto. hgb still escellent. Feel xarelto should continue for now, rationale disc, g I ref, may well be internal hemorrhoids. w s 's discussed.

## 2020-06-02 ENCOUNTER — Telehealth: Payer: Self-pay | Admitting: Family Medicine

## 2020-06-02 MED ORDER — RIVAROXABAN 20 MG PO TABS: 20.0000 mg | ORAL_TABLET | Freq: Every day | ORAL | 0 refills | Status: DC

## 2020-06-02 NOTE — Telephone Encounter (Signed)
Patient is requesting refill on xarelto 20 mg called into [facility] he has appointment on 7/29 to discuss coming off blood thinner. He is down to one pill left until appointment. Please advise

## 2020-06-02 NOTE — Telephone Encounter (Signed)
Patient notified

## 2020-06-05 ENCOUNTER — Ambulatory Visit (INDEPENDENT_AMBULATORY_CARE_PROVIDER_SITE_OTHER): Payer: PRIVATE HEALTH INSURANCE | Admitting: Family Medicine

## 2020-06-05 ENCOUNTER — Other Ambulatory Visit: Payer: Self-pay

## 2020-06-05 ENCOUNTER — Encounter: Payer: Self-pay | Admitting: Family Medicine

## 2020-06-05 VITALS — BP 122/84 | HR 73 | Temp 97.6°F | Ht 74.0 in | Wt 276.6 lb

## 2020-06-05 DIAGNOSIS — F331 Major depressive disorder, recurrent, moderate: Secondary | ICD-10-CM

## 2020-06-05 DIAGNOSIS — Z86718 Personal history of other venous thrombosis and embolism: Secondary | ICD-10-CM

## 2020-06-05 DIAGNOSIS — F4310 Post-traumatic stress disorder, unspecified: Secondary | ICD-10-CM

## 2020-06-05 DIAGNOSIS — Z9889 Other specified postprocedural states: Secondary | ICD-10-CM

## 2020-06-05 IMAGING — MR MR KNEE*R* W/O CM
4 of 7 series · 14 of 40 positions shown · non-contrast
Comparison: January 11, 2019

CLINICAL DATA: Knee pain, locking catching

EXAM:
MRI OF THE RIGHT KNEE WITHOUT CONTRAST
TECHNIQUE: Multiplanar, multisequence MR imaging of the knee was performed. No
intravenous contrast was administered.

[Series 3: T2 fat-sat · axial · 4.0mm · 0.25mm/px · z∈[-55,+60]mm · 3 of 24 slices shown]
[im 1/24]
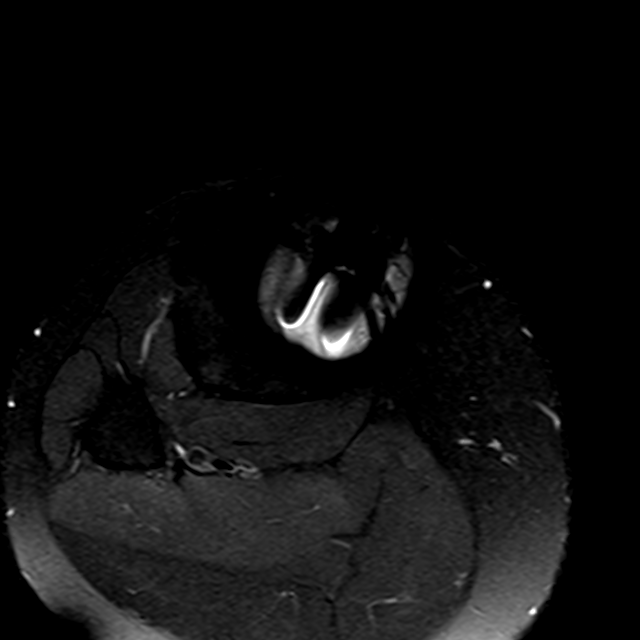
[im 16/24]
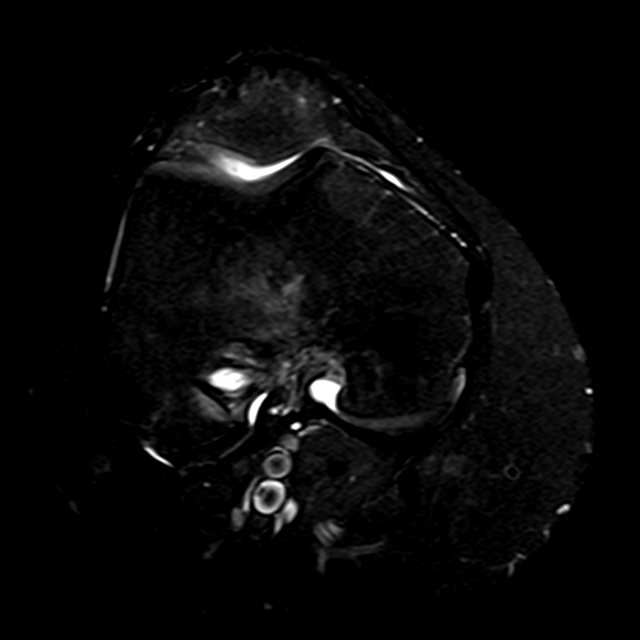
[im 24/24]
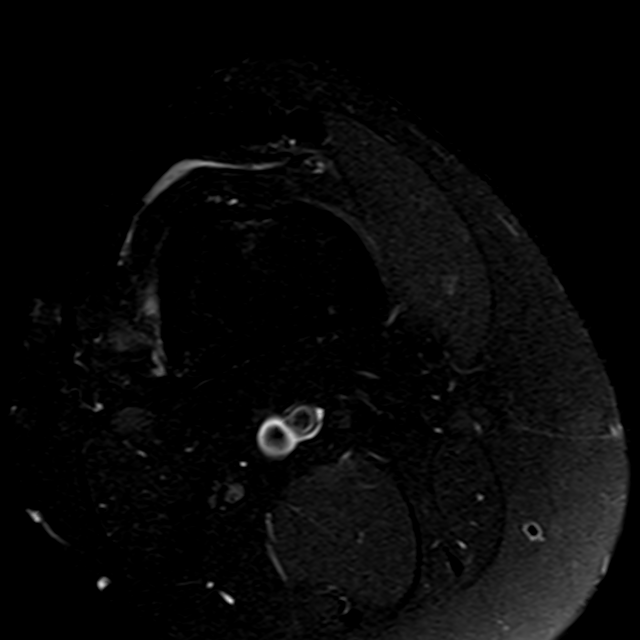

[Series 6: PD fat-sat · coronal · 4.0mm · 0.24mm/px · 5 of 33 slices shown (1 of 3)]
[im 1/33]
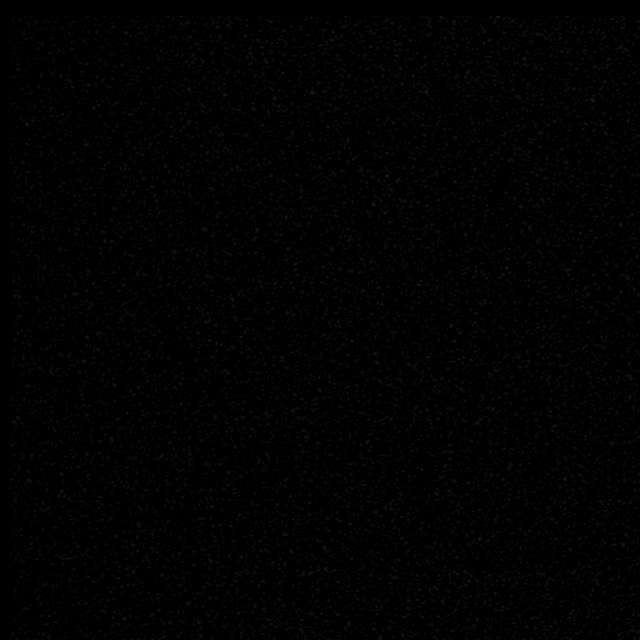
[im 6/33]
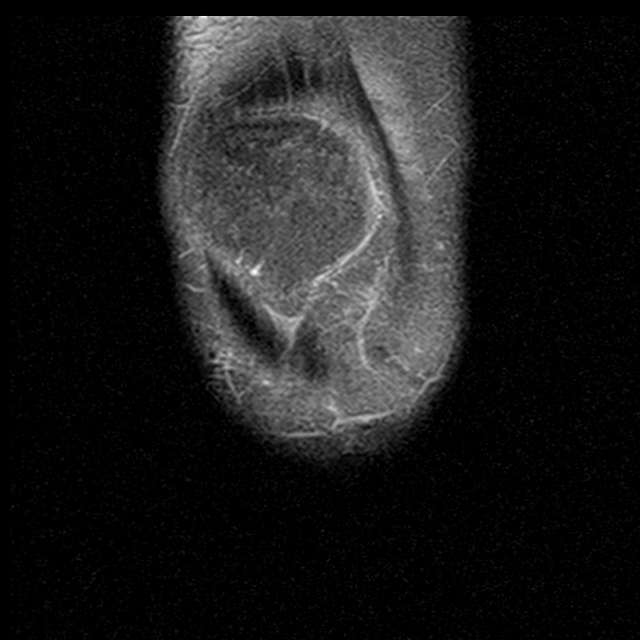
[im 11/33]
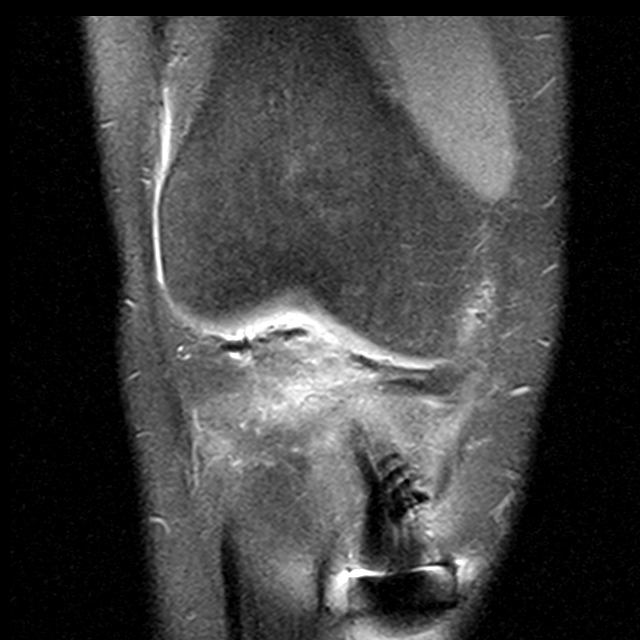
[im 17/33]
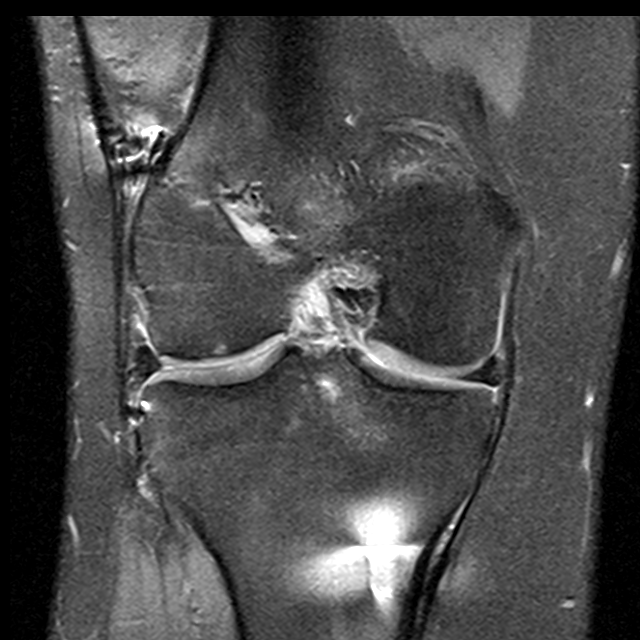
[im 27/33]
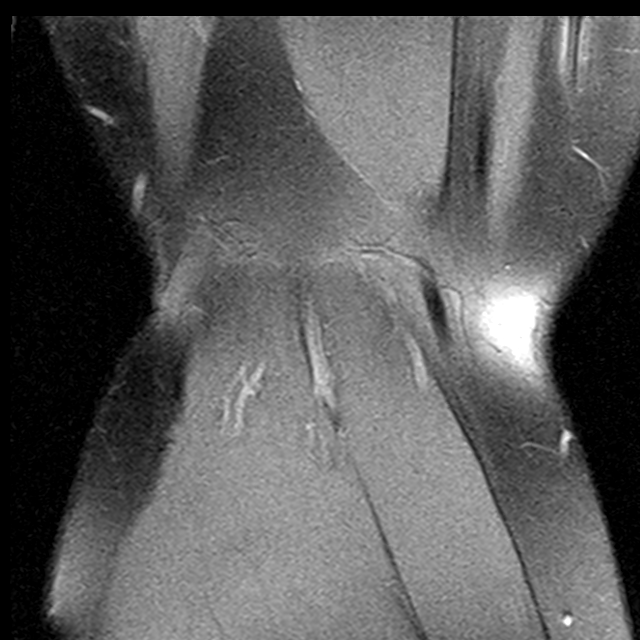

[Series 7: PD fat-sat · sagittal · 3.0mm · 0.23mm/px · 3 of 29 slices shown (2 of 3)]
[im 6/29]
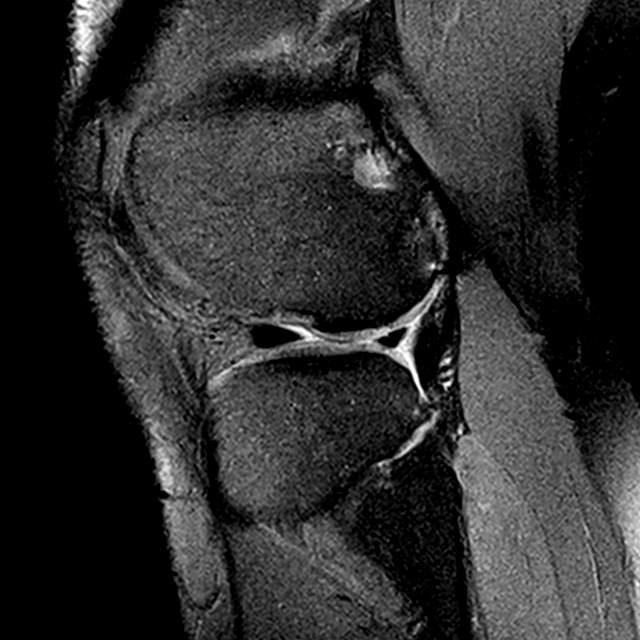
[im 17/29]
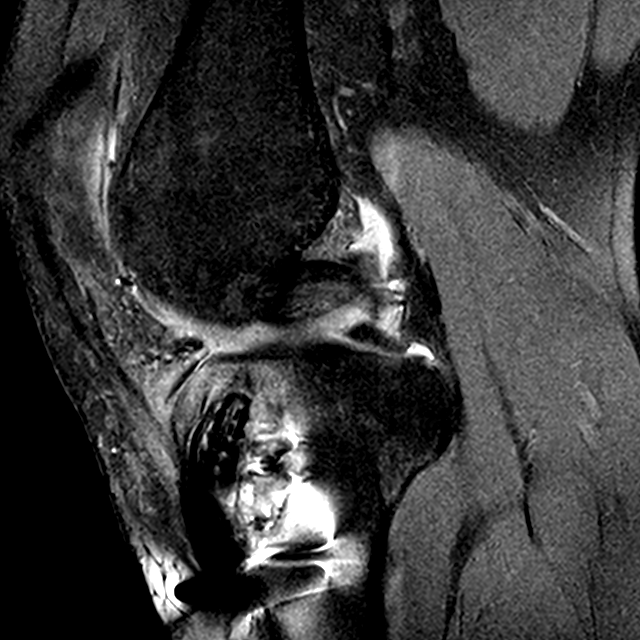
[im 29/29]
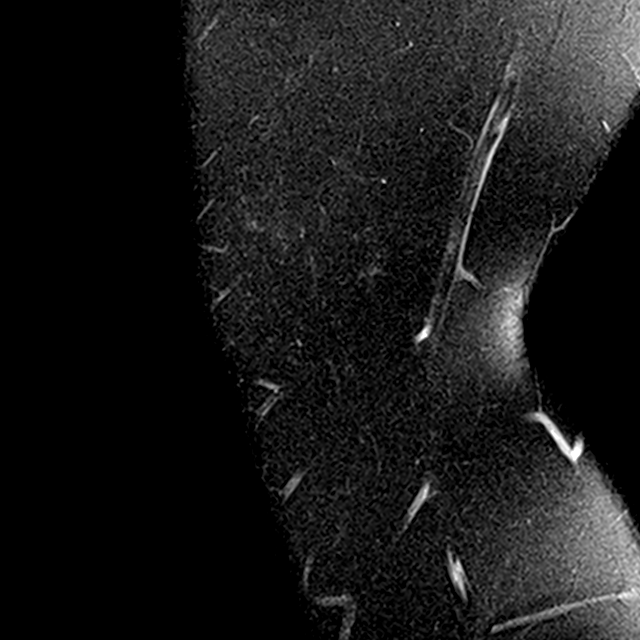

[Series 9: PD fat-sat · coronal · 2.0mm · 0.29mm/px · 3 of 15 slices shown (3 of 3)]
[im 1/15]
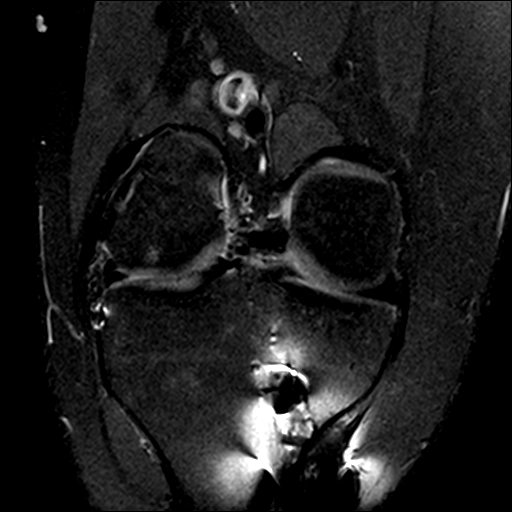
[im 8/15]
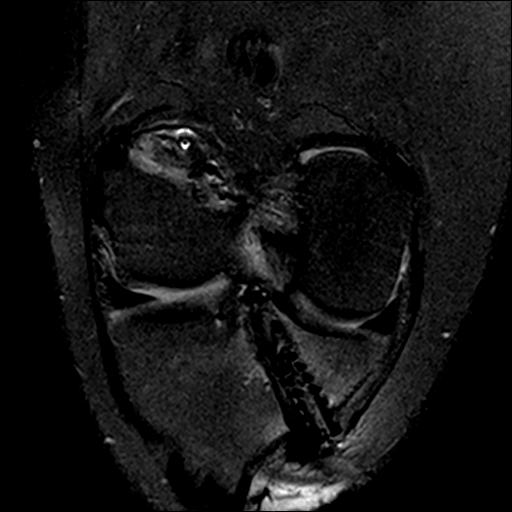
[im 15/15]
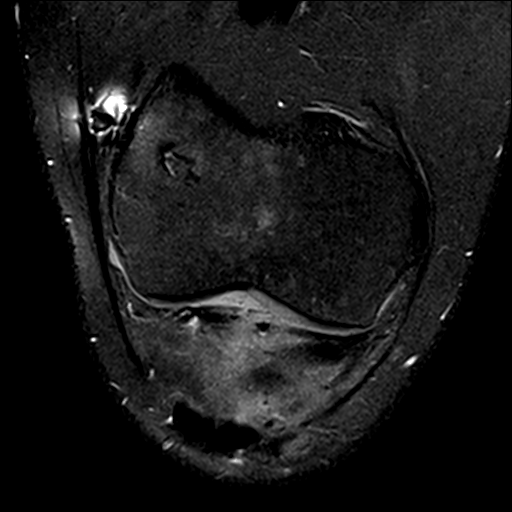

[14 of 40 positions shown; findings below may reference images not displayed]

FINDINGS: MENISCI

Medial: Intact.

Lateral: There is blunting from prior meniscectomy of the posterior
horn of the lateral meniscus.

LIGAMENTS

Cruciates: The patient is status post ACL graft repair since January 11, 2019 where there was a graft rupture. There is overlying
susceptibility artifact. The graft appears to be in appropriate
position, however appears to be buckled at the intracondylar notch.
There is heterogeneous signal seen throughout the graft with small
cystic changes at the tibial tunnel and expansion. The PCL is
intact.

Collaterals: The MCL is intact. The lateral collateral ligamentous
complex is intact.

CARTILAGE

Patellofemoral: Normal.

Medial compartment: Normal.

Lateral compartment: There is mild chondral fissuring seen the
weight-bearing surface of lateral femoral condyle with underlying
subchondral cystic change.

BONES: Reactive marrow seen adjacent to the graft repair. No
avascular necrosis. No pathologic marrow infiltration.

JOINT: No joint effusion. Postsurgical changes seen within Hoffa's
fat pad. No plical thickening.

EXTENSOR MECHANISM: Heterogeneous signal seen at the insertion site
of the patellar tendon. The retinaculum is unremarkable.

POPLITEAL FOSSA: No popliteal cyst.

OTHER:  The visualized muscles are normal in appearance.
IMPRESSION: Buckling of the ACL graft at the intracondylar notch, however
appears intact. There is heterogeneous signal with cystic changes,
likely due to graft degeneration and small tibial tunnel ganglion
cyst.

Post meniscectomy changes of the posterior lateral meniscus.

Mild chondral disease in the lateral compartment

## 2020-06-05 MED ORDER — RIVAROXABAN 20 MG PO TABS: 20.0000 mg | ORAL_TABLET | Freq: Every day | ORAL | 2 refills | Status: DC

## 2020-06-05 MED ORDER — FLUOXETINE HCL 20 MG PO TABS: ORAL_TABLET | ORAL | 0 refills | Status: DC

## 2020-06-05 NOTE — Progress Notes (Deleted)
error 

## 2020-06-05 NOTE — Progress Notes (Signed)
Patient ID: Cory Davis, male    DOB: November 05, 1991, 29 y.o.   MRN: [id number]   Chief Complaint  Patient presents with  . med discussion    f/u h/o dvt on xarelto   Subjective:    HPI Pt has h/o RLE dvt dx on 03/05/20 after surgery on rt knee for ACL and bone graft surgery. Was started on xarelto and discussed with last pcp of staying on the medication for 3-6 months.  Pt wanting to know if needing to stay on the medication.  Pt stating will be having a 2nd surgery on the rt knee in 10/21 to repair the bone graft again.  Has had total of 4 surgeries on the right knee.  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  Pt has a worker's compensation case pending and working with lawyers about the case.   Pt also having concern that the lexapro he's been on isn't working as well.  Pt stating inc the lexapro from 20mg  to 30mg  per his last pcp.  Wanting to see if he could take something else to help with his anxiety and PTSD.  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  Not seeing psychiatry at this time.   Medical History Cory Davis has a past medical history of Chronic back pain, Chronic knee pain, Complication of anesthesia, History of kidney stones, Prediabetes, Renal disorder, and Sciatica.   Outpatient Encounter Medications as of 06/05/2020  Medication Sig  . escitalopram (LEXAPRO) 20 MG tablet Take 1 1/2 tablet by mouth each morning  . rivaroxaban (XARELTO) 20 MG TABS tablet Take 1 tablet (20 mg total) by mouth daily with supper.  . traMADol (ULTRAM) 50 MG tablet Take 50 mg by mouth every 8 (eight) hours as needed.  . [DISCONTINUED] rivaroxaban (XARELTO) 20 MG TABS tablet Take 1 tablet (20 mg total) by mouth daily with supper.  Cory Davis FLUoxetine (PROZAC) 20 MG tablet Take 1 tablet p.o. for 7 days then increase to 2 tablets daily.  . [DISCONTINUED] methocarbamol  (ROBAXIN) 500 MG tablet Take 500 mg by mouth every 8 (eight) hours.  . [DISCONTINUED] oxyCODONE (OXY IR/ROXICODONE) 5 MG immediate release tablet Take 5 mg by mouth.   No facility-administered encounter medications on file as of 06/05/2020.     Review of Systems  Constitutional: Negative for chills and fever.  HENT: Negative for congestion, rhinorrhea and sore throat.   Respiratory: Negative for cough, shortness of breath and wheezing.   Cardiovascular: Negative for chest pain and leg swelling.  Gastrointestinal: Negative for abdominal pain, diarrhea, nausea and vomiting.  Genitourinary: Negative for dysuria and frequency.  Musculoskeletal: Negative for arthralgias and joint swelling.  Skin: Negative for rash.  Neurological: Negative for dizziness, weakness and headaches.  Psychiatric/Behavioral: Negative for agitation, dysphoric mood, self-injury, sleep disturbance and suicidal ideas. The patient is nervous/anxious.      Vitals BP 122/84   Pulse 73   Temp 97.6 F (36.4 C)   Ht 6\' 2"  (1.88 m)   Wt (!) 276 lb 9.6 oz (125.5 kg)   BMI 35.51 kg/m   Objective:   Physical Exam Vitals and nursing note reviewed.  Constitutional:      General: He is not in acute distress.    Appearance: Normal appearance.  Pulmonary:     Effort: No respiratory distress.  Musculoskeletal:  General: Swelling (rt knee, no erythema or tenderness) present. No tenderness. Normal range of motion.     Right lower leg: No edema.     Left lower leg: No edema.     Comments: +scars around rt knee from previous surgery. Neg homan's.  Skin:    General: Skin is warm and dry.     Findings: No rash.  Neurological:     General: No focal deficit present.     Mental Status: He is alert.     Cranial Nerves: No cranial nerve deficit.  Psychiatric:        Mood and Affect: Mood normal.        Behavior: Behavior normal.        Thought Content: Thought content normal.        Judgment: Judgment normal.       Assessment and Plan   1. 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 History of right knee surgery - Ambulatory referral to Hematology  3. PTSD (post-traumatic stress disorder)  4. MDD (major depressive disorder), recurrent episode, moderate (HCC)   H/o dvt 3 months ago after surgery on rt knee.  Cont with xarelto till seen by heme/onc.  Referral placed.  Pt going to have another surgery on rt knee for repair of acl again in 10/21.   Might just stay on the xarelto till after the 2nd surgery.  Pt has h/o ptsd and anxiety, was on lexapro 30mg  and not working as well anymore. Will try to start prozac 20mg  for 7 days then increase to 40mg  daily.  F/u 30 days or prn.

## 2020-06-11 ENCOUNTER — Encounter: Payer: Self-pay | Admitting: Family Medicine

## 2020-07-03 ENCOUNTER — Other Ambulatory Visit: Payer: Self-pay

## 2020-07-03 ENCOUNTER — Telehealth: Payer: Self-pay | Admitting: Family Medicine

## 2020-07-03 ENCOUNTER — Telehealth (INDEPENDENT_AMBULATORY_CARE_PROVIDER_SITE_OTHER): Payer: PRIVATE HEALTH INSURANCE | Admitting: Family Medicine

## 2020-07-03 DIAGNOSIS — F4310 Post-traumatic stress disorder, unspecified: Secondary | ICD-10-CM | POA: Diagnosis not present

## 2020-07-03 DIAGNOSIS — J0100 Acute maxillary sinusitis, unspecified: Secondary | ICD-10-CM

## 2020-07-03 DIAGNOSIS — F331 Major depressive disorder, recurrent, moderate: Secondary | ICD-10-CM | POA: Diagnosis not present

## 2020-07-03 DIAGNOSIS — Z86718 Personal history of other venous thrombosis and embolism: Secondary | ICD-10-CM

## 2020-07-03 MED ORDER — AMOXICILLIN 500 MG PO CAPS: 500.0000 mg | ORAL_CAPSULE | Freq: Three times a day (TID) | ORAL | 0 refills | Status: DC

## 2020-07-03 MED ORDER — RIVAROXABAN 20 MG PO TABS: 20.0000 mg | ORAL_TABLET | Freq: Every day | ORAL | 1 refills | Status: DC

## 2020-07-03 MED ORDER — FLUTICASONE PROPIONATE 50 MCG/ACT NA SUSP: 2.0000 | Freq: Every day | NASAL | 1 refills | Status: DC

## 2020-07-03 MED ORDER — FLUOXETINE HCL 40 MG PO CAPS: 40.0000 mg | ORAL_CAPSULE | Freq: Every day | ORAL | 1 refills | Status: DC

## 2020-07-03 MED ORDER — PREDNISONE 20 MG PO TABS: 40.0000 mg | ORAL_TABLET | Freq: Every day | ORAL | 0 refills | Status: DC

## 2020-07-03 NOTE — Telephone Encounter (Signed)
Mr. furman, trentman are scheduled for a virtual visit with your provider today.    Just as we do with appointments in the office, we must obtain your consent to participate.  Your consent will be active for this visit and any virtual visit you may have with one of our providers in the next 365 days.    If you have a MyChart account, I can also send a copy of this consent to you electronically.  All virtual visits are billed to your insurance company just like a traditional visit in the office.  As this is a virtual visit, video technology does not allow for your provider to perform a traditional examination.  This may limit your provider's ability to fully assess your condition.  If your provider identifies any concerns that need to be evaluated in person or the need to arrange testing such as labs, EKG, etc, we will make arrangements to do so.    Although advances in technology are sophisticated, we cannot ensure that it will always work on either your end or our end.  If the connection with a video visit is poor, we may have to switch to a telephone visit.  With either a video or telephone visit, we are not always able to ensure that we have a secure connection.   I need to obtain your verbal consent now.   Are you willing to proceed with your visit today?   DREYSON MISHKIN has provided verbal consent on 07/03/2020 for a virtual visit (video or telephone).   [name], LPN 03/09/7740  2:87 AM

## 2020-07-03 NOTE — Progress Notes (Addendum)
Patient ID: Cory Davis, male    DOB: [birth year], 29 y.o.   MRN: [id number]   Chief Complaint  Patient presents with  . Follow-up   Subjective:    HPI   Pt here for follow up on anxiety. Pt also having some congestion and runny nose. Congestion began about 2 days ago. Some phelgm but clear in color. No headache, no body aches, no fever. Pt is taking Mucinex, Dayquil/Nyquil, Vicks and Elderberry syrup. Pt states that he is getting relief for home treatment and believes it is due to ragweed.  Pt taking Prozac 20 mg BID for anxiety. No issues or concerns. Pt will need refill on Xarelto also.   Having allergy to ragweed and nasal congestion last 2 days. Dry cough.  No sore throat, fever.  Has runny nose, has clear drainage. Feeling better today than yesterday.   Medical History Cory Davis has a past medical history of Chronic back pain, Chronic knee pain, Complication of anesthesia, History of kidney stones, Prediabetes, Renal disorder, and Sciatica.   Outpatient Encounter Medications as of 07/03/2020  Medication Sig  . rivaroxaban (XARELTO) 20 MG TABS tablet Take 1 tablet (20 mg total) by mouth daily with supper.  . traMADol (ULTRAM) 50 MG tablet Take 50 mg by mouth every 8 (eight) hours as needed.  . [DISCONTINUED] FLUoxetine (PROZAC) 20 MG tablet Take 1 tablet p.o. for 7 days then increase to 2 tablets daily.  . [DISCONTINUED] rivaroxaban (XARELTO) 20 MG TABS tablet Take 1 tablet (20 mg total) by mouth daily with supper.  Marland Kitchen amoxicillin (AMOXIL) 500 MG capsule Take 1 capsule (500 mg total) by mouth 3 (three) times daily.  Marland Kitchen FLUoxetine (PROZAC) 40 MG capsule Take 1 capsule (40 mg total) by mouth daily.  . fluticasone (FLONASE) 50 MCG/ACT nasal spray Place 2 sprays into both nostrils daily.  . predniSONE (DELTASONE) 20 MG tablet Take 2 tablets (40 mg total) by mouth daily with breakfast.  . [DISCONTINUED] escitalopram (LEXAPRO) 20 MG tablet Take 1 1/2 tablet by mouth each morning     No facility-administered encounter medications on file as of 07/03/2020.     Review of Systems  Constitutional: Negative for chills and fever.  HENT: Negative for congestion, rhinorrhea and sore throat.   Respiratory: Negative for cough, shortness of breath and wheezing.   Cardiovascular: Negative for chest pain and leg swelling.  Gastrointestinal: Negative for abdominal pain, diarrhea, nausea and vomiting.  Genitourinary: Negative for dysuria and frequency.  Skin: Negative for rash.  Neurological: Negative for dizziness, weakness and headaches.     Vitals There were no vitals taken for this visit.  Objective:   Physical Exam Vitals and nursing note reviewed.  Constitutional:      General: He is not in acute distress.    Appearance: Normal appearance. He is not ill-appearing.  HENT:     Head: Normocephalic.     Nose: Nose normal. No congestion.     Mouth/Throat:     Mouth: Mucous membranes are moist.     Pharynx: No oropharyngeal exudate.  Eyes:     Extraocular Movements: Extraocular movements intact.     Conjunctiva/sclera: Conjunctivae normal.     Pupils: Pupils are equal, round, and reactive to light.  Cardiovascular:     Rate and Rhythm: Normal rate and regular rhythm.     Pulses: Normal pulses.     Heart sounds: Normal heart sounds. No murmur heard.   Pulmonary:     Effort: Pulmonary effort  is normal.     Breath sounds: Normal breath sounds. No wheezing, rhonchi or rales.  Musculoskeletal:        General: Normal range of motion.     Right lower leg: No edema.     Left lower leg: No edema.  Skin:    General: Skin is warm and dry.     Findings: No rash.  Neurological:     General: No focal deficit present.     Mental Status: He is alert and oriented to person, place, and time.     Cranial Nerves: No cranial nerve deficit.  Psychiatric:        Mood and Affect: Mood normal.        Behavior: Behavior normal.        Thought Content: Thought content normal.         Judgment: Judgment normal.      Assessment and Plan   1. Acute non-recurrent maxillary sinusitis - amoxicillin (AMOXIL) 500 MG capsule; Take 1 capsule (500 mg total) by mouth 3 (three) times daily.  Dispense: 30 capsule; Refill: 0 - predniSONE (DELTASONE) 20 MG tablet; Take 2 tablets (40 mg total) by mouth daily with breakfast.  Dispense: 10 tablet; Refill: 0 - fluticasone (FLONASE) 50 MCG/ACT nasal spray; Place 2 sprays into both nostrils daily.  Dispense: 16 g; Refill: 1  2. History of DVT (deep vein thrombosis) - rivaroxaban (XARELTO) 20 MG TABS tablet; Take 1 tablet (20 mg total) by mouth daily with supper.  Dispense: 90 tablet; Refill: 1  3. PTSD (post-traumatic stress disorder) - FLUoxetine (PROZAC) 40 MG capsule; Take 1 capsule (40 mg total) by mouth daily.  Dispense: 90 capsule; Refill: 1  4. MDD (major depressive disorder), recurrent episode, moderate (HCC) - FLUoxetine (PROZAC) 40 MG capsule; Take 1 capsule (40 mg total) by mouth daily.  Dispense: 90 capsule; Refill: 1   Pt given watch and wait script for amoxicillin if having more facial pain and fever, to start abx.  Take flonase and prednsione.  Advising pt to get covid testing just in case for being able to return to work.  H/o dvt- pt to cont with xarelto, pt having 2nd surgery on same knee.  Will discuss with heme/onc to see about whether to stay on meds since getting ready to have another surgery.  MDD and ptsd- pt stable, cont with prozac.  F/u prn.

## 2020-07-31 ENCOUNTER — Telehealth: Payer: Self-pay | Admitting: Family Medicine

## 2020-07-31 MED ORDER — FLUOXETINE HCL 20 MG PO CAPS: 60.0000 mg | ORAL_CAPSULE | Freq: Every day | ORAL | 1 refills | Status: DC

## 2020-07-31 NOTE — Telephone Encounter (Signed)
Pt wants to see if he can switch his FLUoxetine (PROZAC) 40 MG capsule to up the MG   Pt call back [phone]

## 2020-08-01 NOTE — Telephone Encounter (Signed)
Patient notified

## 2020-08-18 ENCOUNTER — Other Ambulatory Visit: Payer: Self-pay | Admitting: Physician Assistant

## 2020-08-18 DIAGNOSIS — Z4789 Encounter for other orthopedic aftercare: Secondary | ICD-10-CM

## 2020-09-02 ENCOUNTER — Ambulatory Visit
Admission: RE | Admit: 2020-09-02 | Discharge: 2020-09-02 | Disposition: A | Discharge status: Home / Self Care | Payer: Worker's Compensation | Source: Ambulatory Visit | Attending: Physician Assistant | Admitting: Physician Assistant

## 2020-09-02 ENCOUNTER — Other Ambulatory Visit: Payer: Self-pay

## 2020-09-02 DIAGNOSIS — Z4789 Encounter for other orthopedic aftercare: Secondary | ICD-10-CM

## 2020-09-16 ENCOUNTER — Telehealth: Payer: Self-pay

## 2020-09-16 NOTE — Telephone Encounter (Signed)
Patient has physical on 12/9 and needing labs

## 2020-09-18 ENCOUNTER — Other Ambulatory Visit: Payer: Self-pay | Admitting: *Deleted

## 2020-09-18 DIAGNOSIS — Z13220 Encounter for screening for lipoid disorders: Secondary | ICD-10-CM

## 2020-09-18 DIAGNOSIS — Z0000 Encounter for general adult medical examination without abnormal findings: Secondary | ICD-10-CM

## 2020-09-18 DIAGNOSIS — R5383 Other fatigue: Secondary | ICD-10-CM

## 2020-09-18 NOTE — Telephone Encounter (Signed)
Labs ordered, patient notified

## 2020-09-18 NOTE — Telephone Encounter (Signed)
Order, cbc, cmp, lipids. Thx. Dr. Ladona Ridgel

## 2020-09-25 ENCOUNTER — Telehealth: Payer: Self-pay

## 2020-09-25 NOTE — Telephone Encounter (Signed)
Patient is requesting refill fluoxetine 20 mg called into [facility]

## 2020-09-26 ENCOUNTER — Telehealth: Payer: Self-pay

## 2020-09-26 DIAGNOSIS — Z86718 Personal history of other venous thrombosis and embolism: Secondary | ICD-10-CM

## 2020-09-26 NOTE — Telephone Encounter (Signed)
Patient is now ready to be referred to Hematology but they said he will need a new referral because the one from July was canceled.

## 2020-09-27 MED ORDER — FLUOXETINE HCL 20 MG PO CAPS: 60.0000 mg | ORAL_CAPSULE | Freq: Every day | ORAL | 1 refills | Status: DC

## 2020-09-27 NOTE — Telephone Encounter (Signed)
Pls reorder the referral to hematology.  Thx. Dr. Ladona Ridgel

## 2020-09-29 NOTE — Telephone Encounter (Signed)
Cory Davis, [name], DO    pls have pt f/u in 45-60 days for recheck of depression/anxiety. Thx. Dr. Ladona Davis      Prescription sent electronically to pharmacy by provider

## 2020-09-29 NOTE — Telephone Encounter (Addendum)
Referral ordered in Epic. Patient notified. 

## 2020-10-09 ENCOUNTER — Ambulatory Visit (INDEPENDENT_AMBULATORY_CARE_PROVIDER_SITE_OTHER): Payer: Worker's Compensation | Admitting: Orthopedic Surgery

## 2020-10-09 ENCOUNTER — Encounter: Payer: Self-pay | Admitting: Orthopedic Surgery

## 2020-10-09 ENCOUNTER — Other Ambulatory Visit: Payer: Self-pay

## 2020-10-09 VITALS — BP 159/102 | HR 87 | Ht 74.0 in | Wt 275.0 lb

## 2020-10-09 DIAGNOSIS — Z9889 Other specified postprocedural states: Secondary | ICD-10-CM

## 2020-10-09 DIAGNOSIS — S83511D Sprain of anterior cruciate ligament of right knee, subsequent encounter: Secondary | ICD-10-CM

## 2020-10-09 NOTE — Progress Notes (Signed)
Chief Complaint  Patient presents with  . 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  . 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

## 2020-10-13 ENCOUNTER — Ambulatory Visit: Payer: Self-pay | Admitting: Orthopedic Surgery

## 2020-10-16 ENCOUNTER — Encounter: Payer: PRIVATE HEALTH INSURANCE | Admitting: Family Medicine

## 2020-10-27 ENCOUNTER — Telehealth: Payer: Self-pay | Admitting: Family Medicine

## 2020-10-27 ENCOUNTER — Other Ambulatory Visit: Payer: Self-pay

## 2020-10-27 DIAGNOSIS — Z86718 Personal history of other venous thrombosis and embolism: Secondary | ICD-10-CM

## 2020-10-27 NOTE — Telephone Encounter (Signed)
PA received for Xarelto 20 mg. PA completed but states that drug is not covered by plan. My chart message sent to patient.

## 2020-10-28 ENCOUNTER — Inpatient Hospital Stay (HOSPITAL_COMMUNITY): Attending: Hematology and Oncology | Admitting: Hematology and Oncology

## 2020-10-28 ENCOUNTER — Encounter (HOSPITAL_COMMUNITY): Payer: Self-pay | Admitting: Hematology and Oncology

## 2020-10-28 ENCOUNTER — Other Ambulatory Visit: Payer: Self-pay

## 2020-10-28 VITALS — BP 137/80 | HR 85 | Temp 98.9°F | Resp 16 | Ht 74.0 in | Wt 264.3 lb

## 2020-10-28 DIAGNOSIS — M25569 Pain in unspecified knee: Secondary | ICD-10-CM

## 2020-10-28 DIAGNOSIS — Z86718 Personal history of other venous thrombosis and embolism: Secondary | ICD-10-CM | POA: Diagnosis not present

## 2020-10-28 DIAGNOSIS — F17210 Nicotine dependence, cigarettes, uncomplicated: Secondary | ICD-10-CM | POA: Diagnosis not present

## 2020-10-28 DIAGNOSIS — Z87442 Personal history of urinary calculi: Secondary | ICD-10-CM | POA: Diagnosis not present

## 2020-10-28 DIAGNOSIS — Z833 Family history of diabetes mellitus: Secondary | ICD-10-CM

## 2020-10-28 DIAGNOSIS — Z79899 Other long term (current) drug therapy: Secondary | ICD-10-CM

## 2020-10-28 DIAGNOSIS — M549 Dorsalgia, unspecified: Secondary | ICD-10-CM

## 2020-10-28 DIAGNOSIS — Z8249 Family history of ischemic heart disease and other diseases of the circulatory system: Secondary | ICD-10-CM | POA: Diagnosis not present

## 2020-10-28 DIAGNOSIS — Z7901 Long term (current) use of anticoagulants: Secondary | ICD-10-CM | POA: Diagnosis not present

## 2020-10-28 DIAGNOSIS — Z809 Family history of malignant neoplasm, unspecified: Secondary | ICD-10-CM

## 2020-10-28 DIAGNOSIS — I82451 Acute embolism and thrombosis of right peroneal vein: Secondary | ICD-10-CM

## 2020-10-28 DIAGNOSIS — R7303 Prediabetes: Secondary | ICD-10-CM | POA: Diagnosis not present

## 2020-10-28 NOTE — Progress Notes (Signed)
[facility] CONSULT NOTE  Patient Care Team: Annalee Genta, DO as PCP - General (Family Medicine)  CHIEF COMPLAINTS/PURPOSE OF CONSULTATION:   History of Acute DVT  ASSESSMENT & PLAN:  No problem-specific Assessment & Plan notes found for this encounter.  No orders of the defined types were placed in this encounter.  This is a very pleasant 29 year old male patient with a past medical history significant for chronic back pain, prediabetes referred to hematology for evaluation and recommendations regarding history of right lower extremity DVT. Cory Davis is here for an initial evaluation.  He had right anterior crucial ligament tear and had surgery for it back in April with some bone grafting in a couple days after surgery presented with swelling and pain of the right lower extremity was found to have right peroneal vein and gastronomy is vein DVT, subsequently started on anticoagulation with Xarelto and the symptoms have resolved quickly.  He does not have any prior history of DVT or PE.  No family history of hypercoagulable disorders. This is likely a provoked DVT secondary to surgery and hence anticoagulation with Xarelto for 3 months is appropriate.  He was however anticipated to have more surgeries hence continued on Xarelto.  At this time since he is not anticipating any additional surgeries, I believe he can discontinue Xarelto and follow-up with [place] as needed.  If he is anticipating any procedures, then he was instructed to give [place] a call and to discuss recommendations.  I do not believe he needs any hypercoagulable work-up since there is a clear etiology for his DVT. He is agreeable to these recommendations. Thank you for consulting [place] in the care of this patient.  Please not hesitate to contact us with any additional questions or concerns.  HISTORY OF PRESENTING ILLNESS:  Cory Davis 29 y.o. male is here because of history of DVT  This is a very pleasant  29 year old male patient with past medical history significant for kidney stones, prediabetes, DVT in the right peroneal vein diagnosed in April 2021 after knee surgery who was referred to hematology for anticoagulation recommendations.  Patient apparently had some bone grafting for ACL tear back in April 2021 in about 2 days after the surgery had severe leg swelling and pain in the right leg, diagnosed with acute DVT of the peroneal vein and started on anticoagulation with Xarelto.  He was kept on Xarelto because he was anticipating more surgeries but he has now learned from his orthopedic surgeon that he might not be having any more surgeries and hence referred to hematology for comment on duration of anticoagulation and the need for hypercoagulable work-up.  Cory Davis is a good historian.  He denies any prior history of DVT or PE.  He denies any family history of DVT or PE. He is pretty healthy at baseline except for some injuries related to work and history of recurrent kidney stones.  No family history of cancers.  Review of systems as mentioned below are negative except for some intermittent back pain and knee pain.  REVIEW OF SYSTEMS:   Constitutional: Denies fevers, chills or abnormal night sweats Eyes: Denies blurriness of vision, double vision or watery eyes Ears, nose, mouth, throat, and face: Denies mucositis or sore throat Respiratory: Denies cough, dyspnea or wheezes Cardiovascular: Denies palpitation, chest discomfort or lower extremity swelling Gastrointestinal:  Denies nausea, heartburn or change in bowel habits Skin: Denies abnormal skin rashes Lymphatics: Denies new lymphadenopathy or easy bruising Neurological:Denies numbness, tingling  or new weaknesses Behavioral/Psych: Mood is stable, no new changes  All other systems were reviewed with the patient and are negative.  MEDICAL HISTORY:  Past Medical History:  Diagnosis Date  . Chronic back pain   . Chronic knee pain   .  Complication of anesthesia    sts, " I woke up during surgery and sat up".  . History of kidney stones   . Prediabetes   . Renal disorder   . Sciatica     SURGICAL HISTORY: Past Surgical History:  Procedure Laterality Date  . ANTERIOR CRUCIATE LIGAMENT REPAIR Right   . ANTERIOR CRUCIATE LIGAMENT REPAIR Right 04/10/2019   Procedure: RIGHT ANTERIOR CRUCIATE LIGAMENT (ACL) REVISION REPAIR WITH ALLOGRAFT;  Surgeon: Vickki Hearing, MD;  Location: [facility];  Service: Orthopedics;  Laterality: Right;  . KNEE ARTHROPLASTY Right    broken knee cap    SOCIAL HISTORY: Social History   Socioeconomic History  . Marital status: Married    Spouse name: Not on file  . Number of children: Not on file  . Years of education: Not on file  . Highest education level: Not on file  Occupational History  . Not on file  Tobacco Use  . Smoking status: Current Every Day Smoker    Packs/day: 0.50    Years: 10.00    Pack years: 5.00    Types: Cigarettes  . Smokeless tobacco: Former Clinical biochemist  . Vaping Use: Never used  Substance and Sexual Activity  . Alcohol use: No    Comment: rare  . Drug use: No  . Sexual activity: Yes  Other Topics Concern  . Not on file  Social History Narrative  . Not on file   Social Determinants of Health   Financial Resource Strain: Low Risk   . Difficulty of Paying Living Expenses: Not hard at all  Food Insecurity: No Food Insecurity  . Worried About Programme researcher, broadcasting/film/video in the Last Year: Never true  . Ran Out of Food in the Last Year: Never true  Transportation Needs: No Transportation Needs  . Lack of Transportation (Medical): No  . Lack of Transportation (Non-Medical): No  Physical Activity: Inactive  . Days of Exercise per Week: 0 days  . Minutes of Exercise per Session: 0 min  Stress: No Stress Concern Present  . Feeling of Stress : Not at all  Social Connections: Moderately Integrated  . Frequency of Communication with Friends and Family: More  than three times a week  . Frequency of Social Gatherings with Friends and Family: Once a week  . Attends Religious Services: More than 4 times per year  . Active Member of Clubs or Organizations: No  . Attends Banker Meetings: Never  . Marital Status: Married  Catering manager Violence: Not At Risk  . Fear of Current or Ex-Partner: No  . Emotionally Abused: No  . Physically Abused: No  . Sexually Abused: No    FAMILY HISTORY: Family History  Problem Relation Age of Onset  . Diabetes Other   . Diabetes Mother   . Hypertension Father   . Cancer Sister   . Diabetes Maternal Grandfather   . Hypertension Maternal Grandfather     ALLERGIES:  has No Known Allergies.  MEDICATIONS:  Current Outpatient Medications  Medication Sig Dispense Refill  . Acetaminophen (TYLENOL PO) Take by mouth.    Marland Kitchen FLUoxetine (PROZAC) 20 MG capsule Take 3 capsules (60 mg total) by mouth daily. 90 capsule  1  . rivaroxaban (XARELTO) 20 MG TABS tablet Take 1 tablet (20 mg total) by mouth daily with supper. 90 tablet 1   No current facility-administered medications for this visit.     PHYSICAL EXAMINATION: ECOG PERFORMANCE STATUS: 0 - Asymptomatic  Vitals:   10/28/20 1534  BP: 137/80  Pulse: 85  Resp: 16  Temp: 98.9 F (37.2 C)  SpO2: 100%   Filed Weights   10/28/20 1534  Weight: 264 lb 4.8 oz (119.9 kg)    GENERAL:alert, no distress and comfortable SKIN: skin color, texture, turgor are normal, no rashes or significant lesions EYES: normal, conjunctiva are pink and non-injected, sclera clear OROPHARYNX:no exudate, no erythema and lips, buccal mucosa, and tongue normal  NECK: supple, thyroid normal size, non-tender, without nodularity LYMPH:  no palpable lymphadenopathy in the cervical, axillary or inguinal LUNGS: clear to auscultation and percussion with normal breathing effort HEART: regular rate & rhythm and no murmurs and no lower extremity edema ABDOMEN:abdomen soft,  non-tender and normal bowel sounds Musculoskeletal:no cyanosis of digits and no clubbing  PSYCH: alert & oriented x 3 with fluent speech NEURO: no focal motor/sensory deficits  LABORATORY DATA:  I have reviewed the data as listed Lab Results  Component Value Date   WBC 12.1 (H) 03/05/2020   HGB 15.0 (A) 04/01/2020   HCT 38.6 (L) 03/05/2020   MCV 85.4 03/05/2020   PLT 328 03/05/2020     Chemistry      Component Value Date/Time   NA 135 03/05/2020 1212   K 3.9 03/05/2020 1212   CL 105 03/05/2020 1212   CO2 24 03/05/2020 1212   BUN 8 03/05/2020 1212   CREATININE 0.83 03/05/2020 1212      Component Value Date/Time   CALCIUM 9.1 03/05/2020 1212   ALKPHOS 55 06/09/2016 2149   AST 20 06/09/2016 2149   ALT 34 06/09/2016 2149   BILITOT 0.5 06/09/2016 2149       RADIOGRAPHIC STUDIES: As mentioned above.  All questions were answered. The patient knows to call the clinic with any problems, questions or concerns. I spent 30 minutes in the care of this patient including H and P, review of records, planning and coordination of care.    Rachel Moulds, MD 10/28/2020 3:40 PM

## 2020-11-06 ENCOUNTER — Telehealth: Payer: Self-pay

## 2020-11-06 NOTE — Telephone Encounter (Signed)
Pt contacted and informed to call pharmacy to have meds switched. Pt verbalized understanding.

## 2020-11-06 NOTE — Telephone Encounter (Signed)
Pt contacted. Pt states he saw hematology and she stated that it was ok for patient to discontinue Xarelto.

## 2020-11-06 NOTE — Telephone Encounter (Signed)
Pt needs to switch Pharmacy to [facility] [place], [place] [address] due to insurance

## 2020-12-06 ENCOUNTER — Other Ambulatory Visit: Payer: Self-pay | Admitting: Family Medicine

## 2020-12-10 ENCOUNTER — Telehealth: Payer: Self-pay

## 2020-12-10 ENCOUNTER — Other Ambulatory Visit: Payer: Self-pay

## 2020-12-10 MED ORDER — FLUOXETINE HCL 20 MG PO CAPS: 60.0000 mg | ORAL_CAPSULE | Freq: Every day | ORAL | 0 refills | Status: DC

## 2020-12-10 NOTE — Telephone Encounter (Signed)
Pt needs follow up for depression.  Pls give 30 day supply with no refill on prozac. Thx. Dr. Ladona Ridgel

## 2020-12-10 NOTE — Telephone Encounter (Signed)
Pt made Med Check appt for 02/24 @ 9:40

## 2020-12-10 NOTE — Telephone Encounter (Signed)
LVM to schedule

## 2020-12-10 NOTE — Telephone Encounter (Signed)
Per other phone message, 30 day supply sent to [facility] Prozac and pt is aware

## 2020-12-10 NOTE — Telephone Encounter (Signed)
Pt needs refill on FLUoxetine (PROZAC) 20 MG capsule needs to  [facility] [place], [place]    [phone]

## 2020-12-10 NOTE — Telephone Encounter (Signed)
Please schedule visit and then route back to nurses to send in refill 

## 2020-12-10 NOTE — Telephone Encounter (Signed)
Seen 07/03/20 for depression.

## 2021-01-01 ENCOUNTER — Other Ambulatory Visit: Payer: Self-pay

## 2021-01-01 ENCOUNTER — Encounter: Payer: Self-pay | Admitting: Family Medicine

## 2021-01-01 ENCOUNTER — Ambulatory Visit: Admitting: Family Medicine

## 2021-01-01 VITALS — BP 118/76 | HR 89 | Temp 97.2°F | Wt 258.6 lb

## 2021-01-01 DIAGNOSIS — F331 Major depressive disorder, recurrent, moderate: Secondary | ICD-10-CM

## 2021-01-01 DIAGNOSIS — F4310 Post-traumatic stress disorder, unspecified: Secondary | ICD-10-CM

## 2021-01-01 MED ORDER — BUSPIRONE HCL 7.5 MG PO TABS: 7.5000 mg | ORAL_TABLET | Freq: Two times a day (BID) | ORAL | 0 refills | Status: DC

## 2021-01-01 MED ORDER — FLUOXETINE HCL 20 MG PO CAPS: 60.0000 mg | ORAL_CAPSULE | Freq: Every day | ORAL | 3 refills | Status: DC

## 2021-01-01 NOTE — Progress Notes (Signed)
Patient ID: Cory Davis, male    DOB: Feb 15, 1991, 30 y.o.   MRN: [id number]   Chief Complaint  Patient presents with  . Depression   Subjective:    HPI   Cc- ptsd and depression f/u.  Pt is currently taking 60 mg Prozac each morning for several months. Pt would like to possible increase the dose.  Taking melatonin at night for insomnia.  Helping some but getting 4-5 hrs.  Helping with falling asleep but not staying asleep.  10mg  melatonin.  Having more irritability and restless, and trouble with sleep. Having good and bad days.  1x per week having bad day.   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 When in .  Was having ptsd when had it at peak. Was in 3 psychiatry hosp in past.  Pt not working and retired from [employer].  Was having 3 knee surgery rt knee. Tried to replace the acl. Bone graft didn't work.  Finished the pt and did well with that. No weakness or buckling.  Was working police station an now on disability. Just noticing with cold or rain it's flared at times not needing meds for it. Seeing Dr. [employer], gave a brace. And may need replacement down the road.   Medical History Cory Davis has a past medical history of Chronic back pain, Chronic knee pain, Complication of anesthesia, History of kidney stones, Prediabetes, Renal disorder, and Sciatica.   Outpatient Encounter Medications as of 01/01/2021  Medication Sig  . Acetaminophen (TYLENOL PO) Take by mouth.  . busPIRone (BUSPAR) 7.5 MG tablet Take 1 tablet (7.5 mg total) by mouth 2 (two) times daily.  . [DISCONTINUED] FLUoxetine (PROZAC) 20 MG capsule Take 3 capsules (60 mg total) by mouth daily.  01/03/2021 FLUoxetine (PROZAC) 20 MG capsule Take 3 capsules (60 mg total) by mouth daily.  . [DISCONTINUED] rivaroxaban (XARELTO) 20 MG TABS tablet Take 1 tablet (20 mg total) by mouth daily with supper.   No  facility-administered encounter medications on file as of 01/01/2021.     Review of Systems  Constitutional: Negative for chills and fever.  HENT: Negative for congestion, rhinorrhea and sore throat.   Respiratory: Negative for cough, shortness of breath and wheezing.   Cardiovascular: Negative for chest pain and leg swelling.  Gastrointestinal: Negative for abdominal pain, diarrhea, nausea and vomiting.  Genitourinary: Negative for dysuria and frequency.  Skin: Negative for rash.  Neurological: Negative for dizziness, weakness and headaches.  Psychiatric/Behavioral: Positive for agitation and sleep disturbance. Negative for dysphoric mood, self-injury and suicidal ideas. The patient is not nervous/anxious.      Vitals BP 118/76   Pulse 89   Temp (!) 97.2 F (36.2 C)   Wt 258 lb 9.6 oz (117.3 kg)   SpO2 98%   BMI 33.20 kg/m   Objective:   Physical Exam Vitals and nursing note reviewed.  Constitutional:      General: He is not in acute distress.    Appearance: Normal appearance. He is not ill-appearing.  Cardiovascular:     Rate and Rhythm: Normal rate and regular rhythm.     Pulses: Normal pulses.     Heart sounds: Normal heart sounds.  Pulmonary:     Effort: Pulmonary effort is normal. No respiratory distress.     Breath sounds: Normal breath sounds.  Musculoskeletal:        General: Normal range of motion.  Skin:    General: Skin is warm and dry.     Findings: No rash.  Neurological:     General: No focal deficit present.     Mental Status: He is alert and oriented to person, place, and time.  Psychiatric:        Mood and Affect: Mood normal.        Behavior: Behavior normal.        Thought Content: Thought content normal.        Judgment: Judgment normal.      Assessment and Plan   1. MDD (major depressive disorder), recurrent episode, moderate (HCC) - FLUoxetine (PROZAC) 20 MG capsule; Take 3 capsules (60 mg total) by mouth daily.  Dispense: 90 capsule;  Refill: 3  2. PTSD (post-traumatic stress disorder) - busPIRone (BUSPAR) 7.5 MG tablet; Take 1 tablet (7.5 mg total) by mouth 2 (two) times daily.  Dispense: 60 tablet; Refill: 0   MDD and PTSD- not as well controlled.  -feeling some anxiety and agitation. Will add buspar 7.5mg  bid.  Cont with 60mg  prozac daily.  Call or rto if not improving.  Return in about 4 weeks (around 01/29/2021) for f/u anxiety- phone.

## 2021-01-29 ENCOUNTER — Telehealth (INDEPENDENT_AMBULATORY_CARE_PROVIDER_SITE_OTHER): Admitting: Family Medicine

## 2021-01-29 ENCOUNTER — Telehealth: Payer: Self-pay | Admitting: Family Medicine

## 2021-01-29 ENCOUNTER — Other Ambulatory Visit: Payer: Self-pay

## 2021-01-29 DIAGNOSIS — F4310 Post-traumatic stress disorder, unspecified: Secondary | ICD-10-CM | POA: Diagnosis not present

## 2021-01-29 DIAGNOSIS — F32A Depression, unspecified: Secondary | ICD-10-CM

## 2021-01-29 MED ORDER — BUSPIRONE HCL 7.5 MG PO TABS: 7.5000 mg | ORAL_TABLET | Freq: Two times a day (BID) | ORAL | 0 refills | Status: DC

## 2021-01-29 NOTE — Telephone Encounter (Signed)
[name], [place] are scheduled for a virtual visit with your provider today.    Just as we do with appointments in the office, we must obtain your consent to participate.  Your consent will be active for this visit and any virtual visit you may have with one of our providers in the next 365 days.    If you have a MyChart account, I can also send a copy of this consent to you electronically.  All virtual visits are billed to your insurance company just like a traditional visit in the office.  As this is a virtual visit, video technology does not allow for your provider to perform a traditional examination.  This may limit your provider's ability to fully assess your condition.  If your provider identifies any concerns that need to be evaluated in person or the need to arrange testing such as labs, EKG, etc, we will make arrangements to do so.    Although advances in technology are sophisticated, we cannot ensure that it will always work on either your end or our end.  If the connection with a video visit is poor, we may have to switch to a telephone visit.  With either a video or telephone visit, we are not always able to ensure that we have a secure connection.   I need to obtain your verbal consent now.   Are you willing to proceed with your visit today?   RODNEY YERA has provided verbal consent on 01/29/2021 for a virtual visit (video or telephone).   [name], LPN 04/05/4131  4:40 AM

## 2021-01-29 NOTE — Progress Notes (Signed)
Patient ID: Cory Davis, male    DOB: 07-Sep-1991, 30 y.o.   MRN: [id number]   Virtual Visit via Telephone Note  I connected with Cory Davis on 01/29/21 at  8:40 AM EDT by telephone and verified that I am speaking with the correct person using two identifiers.  Location: Patient: home Provider: office   I discussed the limitations, risks, security and privacy concerns of performing an evaluation and management service by telephone and the availability of in person appointments. I also discussed with the patient that there may be a patient responsible charge related to this service. The patient expressed understanding and agreed to proceed.   Chief Complaint  Patient presents with  . Anxiety   Subjective:    HPI   Pt here for follow up on anxiety. Pt states he has only taken the Buspar once and it was effective. Pt states that the Buspar is only as needed. Also taking Prozac 20 mg 3 capsules daily.   Only had to take the buspar once. Taking buspar 7.5mg  bid prn. Seemed that it helped and more relaxed after taking it.  Still taking 60mg  prozac.   Not having increase in agitation or depression.  Sleep is going okay, some good days and bad days.  Feeling he likes where he is for now on the prozac.   Medical History [place] has a past medical history of Chronic back pain, Chronic knee pain, Complication of anesthesia, History of kidney stones, Prediabetes, Renal disorder, and Sciatica.   Outpatient Encounter Medications as of 01/29/2021  Medication Sig  . Acetaminophen (TYLENOL PO) Take by mouth.  01/31/2021 FLUoxetine (PROZAC) 20 MG capsule Take 3 capsules (60 mg total) by mouth daily.  . [DISCONTINUED] busPIRone (BUSPAR) 7.5 MG tablet Take 1 tablet (7.5 mg total) by mouth 2 (two) times daily.  . busPIRone (BUSPAR) 7.5 MG tablet Take 1 tablet (7.5 mg total) by mouth 2 (two) times daily.  . [DISCONTINUED] busPIRone (BUSPAR) 7.5 MG tablet Take 1 tablet (7.5 mg total) by mouth  2 (two) times daily.   No facility-administered encounter medications on file as of 01/29/2021.     Review of Systems  Constitutional: Negative for chills and fever.  HENT: Negative for congestion, rhinorrhea and sore throat.   Respiratory: Negative for cough, shortness of breath and wheezing.   Cardiovascular: Negative for chest pain and leg swelling.  Gastrointestinal: Negative for abdominal pain, diarrhea, nausea and vomiting.  Genitourinary: Negative for dysuria and frequency.  Skin: Negative for rash.  Neurological: Negative for dizziness, weakness and headaches.  Psychiatric/Behavioral: Positive for agitation (improving) and dysphoric mood (improving). Negative for self-injury, sleep disturbance and suicidal ideas. The patient is not nervous/anxious.      Vitals There were no vitals taken for this visit.  Objective:   Physical Exam  No PE due to phone visit.  Assessment and Plan   1. PTSD (post-traumatic stress disorder) - busPIRone (BUSPAR) 7.5 MG tablet; Take 1 tablet (7.5 mg total) by mouth 2 (two) times daily.  Dispense: 60 tablet; Refill: 0  2. Depression, unspecified depression type   Pt to cont with prozac and the added buspar has helped with the agitation.  Will cont.   F/u 36mo or prn.   Return in about 3 months (around 05/01/2021) for f/u depression/ptsd.     Follow Up Instructions:    I discussed the assessment and treatment plan with the patient. The patient was provided an opportunity to ask questions and all  were answered. The patient agreed with the plan and demonstrated an understanding of the instructions.   The patient was advised to call back or seek an in-person evaluation if the symptoms worsen or if the condition fails to improve as anticipated.  I provided 12 minutes of non-face-to-face time during this encounter.

## 2021-02-08 ENCOUNTER — Encounter: Payer: Self-pay | Admitting: Family Medicine

## 2021-03-03 ENCOUNTER — Other Ambulatory Visit: Payer: Self-pay | Admitting: Family Medicine

## 2021-03-03 DIAGNOSIS — F4310 Post-traumatic stress disorder, unspecified: Secondary | ICD-10-CM

## 2021-04-06 ENCOUNTER — Other Ambulatory Visit: Payer: Self-pay | Admitting: Family Medicine

## 2021-04-06 DIAGNOSIS — F4310 Post-traumatic stress disorder, unspecified: Secondary | ICD-10-CM

## 2021-04-08 NOTE — Telephone Encounter (Signed)
F/u in end of June for refills for anxiety.  Dr. Ladona Ridgel

## 2021-04-08 NOTE — Telephone Encounter (Signed)
Last seen 01/29/21 for this med.

## 2021-04-08 NOTE — Telephone Encounter (Signed)
Sent mychart message

## 2021-06-07 ENCOUNTER — Telehealth: Payer: Self-pay | Admitting: Family Medicine

## 2021-06-07 DIAGNOSIS — F331 Major depressive disorder, recurrent, moderate: Secondary | ICD-10-CM

## 2021-06-08 NOTE — Telephone Encounter (Signed)
Please contact patient to have him set up appt. Thank you! 

## 2021-06-09 NOTE — Telephone Encounter (Signed)
Sent mychart message

## 2021-07-01 NOTE — Telephone Encounter (Signed)
Patient has new doctor now cancel all prescription for Dr. Ladona Ridgel

## 2021-07-01 NOTE — Telephone Encounter (Signed)
No longer our patient

## 2023-01-06 ENCOUNTER — Encounter: Payer: Self-pay | Admitting: Radiology

## 2023-09-06 ENCOUNTER — Emergency Department (HOSPITAL_COMMUNITY): Payer: TRICARE For Life (TFL)

## 2023-09-06 ENCOUNTER — Other Ambulatory Visit: Payer: Self-pay

## 2023-09-06 ENCOUNTER — Emergency Department (HOSPITAL_COMMUNITY)
Admission: EM | Admit: 2023-09-06 | Discharge: 2023-09-06 | Disposition: A | Discharge status: Home / Self Care | Attending: Emergency Medicine | Admitting: Emergency Medicine

## 2023-09-06 ENCOUNTER — Encounter (HOSPITAL_COMMUNITY): Payer: Self-pay

## 2023-09-06 DIAGNOSIS — R319 Hematuria, unspecified: Secondary | ICD-10-CM

## 2023-09-06 DIAGNOSIS — N200 Calculus of kidney: Secondary | ICD-10-CM

## 2023-09-06 DIAGNOSIS — Z7982 Long term (current) use of aspirin: Secondary | ICD-10-CM | POA: Diagnosis not present

## 2023-09-06 DIAGNOSIS — N202 Calculus of kidney with calculus of ureter: Secondary | ICD-10-CM | POA: Insufficient documentation

## 2023-09-06 LAB — URINALYSIS, ROUTINE W REFLEX MICROSCOPIC
Bacteria, UA: NONE SEEN
Bilirubin Urine: NEGATIVE
Glucose, UA: NEGATIVE mg/dL
Ketones, ur: NEGATIVE mg/dL
Leukocytes,Ua: NEGATIVE
Nitrite: NEGATIVE
Protein, ur: 30 mg/dL — AB
Specific Gravity, Urine: 1.004 — ABNORMAL LOW (ref 1.005–1.030)
pH: 8 (ref 5.0–8.0)

## 2023-09-06 MED ORDER — ONDANSETRON 4 MG PO TBDP: 4.0000 mg | ORAL_TABLET | Freq: Three times a day (TID) | ORAL | 0 refills | Status: AC | PRN

## 2023-09-06 MED ORDER — TAMSULOSIN HCL 0.4 MG PO CAPS: 0.4000 mg | ORAL_CAPSULE | Freq: Every day | ORAL | 0 refills | Status: AC

## 2023-09-06 MED ORDER — OXYCODONE-ACETAMINOPHEN 5-325 MG PO TABS: 1.0000 | ORAL_TABLET | ORAL | 0 refills | Status: AC | PRN

## 2023-09-06 NOTE — ED Triage Notes (Signed)
C/o hematuria x2 days.  Denies abd pain, flank pain, n/v/d.

## 2023-09-06 NOTE — Discharge Instructions (Addendum)
Return if any problems.

## 2023-09-08 NOTE — ED Provider Notes (Signed)
[place] EMERGENCY DEPARTMENT AT [facility] Provider Note   CSN: [id number] Arrival date & time: 09/06/23  4098     History  Chief Complaint  Patient presents with   Hematuria    Cory Davis is a 32 y.o. male.  Complains of blood in his urine.  Patient reports that he has noticed blood in his urine for the past 2 days.  Patient reports he noticed a lot of blood today patient reports he is not having any abdominal pain he is not having any flank pain.  Has any fever or chills.  He reports he has had multiple kidney stones in the past.  The history is provided by the patient. No language interpreter was used.  Hematuria This is a new problem. The current episode started yesterday. The problem occurs constantly. Pertinent negatives include no abdominal pain. Nothing aggravates the symptoms. Nothing relieves the symptoms. He has tried nothing for the symptoms.       Home Medications Prior to Admission medications   Medication Sig Start Date End Date Taking? Authorizing Provider  aspirin 81 MG chewable tablet Chew 1 tablet by mouth daily. 02/18/21  Yes [provider]  brexpiprazole (REXULTI) 2 MG TABS tablet Take 2 mg by mouth daily. 01/17/23  Yes [provider]  buPROPion (WELLBUTRIN SR) 150 MG 12 hr tablet Take 150 mg by mouth 2 (two) times daily. 12/06/22  Yes [provider]  busPIRone (BUSPAR) 30 MG tablet Take 30 mg by mouth daily. 12/06/22  Yes [provider]  ondansetron (ZOFRAN-ODT) 4 MG disintegrating tablet Take 1 tablet (4 mg total) by mouth every 8 (eight) hours as needed for nausea or vomiting. 09/06/23  Yes Elson Areas, PA-C  oxyCODONE-acetaminophen (PERCOCET) 5-325 MG tablet Take 1 tablet by mouth every 4 (four) hours as needed for severe pain (pain score 7-10). 09/06/23 09/05/24 Yes Elson Areas, PA-C  tamsulosin (FLOMAX) 0.4 MG CAPS capsule Take 1 capsule (0.4 mg total) by mouth daily. 09/06/23  Yes Elson Areas, PA-C      Allergies    Patient has no known allergies.    Review of Systems   Review of Systems  Gastrointestinal:  Negative for abdominal pain.  Genitourinary:  Positive for hematuria.  All other systems reviewed and are negative.   Physical Exam Updated Vital Signs BP 114/75   Pulse 77   Temp 97.9 F (36.6 C) (Oral)   Resp 18   Wt 124.7 kg   SpO2 97%   BMI 35.31 kg/m  Physical Exam Vitals and nursing note reviewed.  Constitutional:      Appearance: He is well-developed.  HENT:     Head: Normocephalic.  Cardiovascular:     Rate and Rhythm: Normal rate.  Pulmonary:     Effort: Pulmonary effort is normal.  Abdominal:     General: There is no distension.  Musculoskeletal:        General: Normal range of motion.     Cervical back: Normal range of motion.  Skin:    General: Skin is warm.  Neurological:     General: No focal deficit present.     Mental Status: He is alert and oriented to person, place, and time.     ED Results / Procedures / Treatments   Labs (all labs ordered are listed, but only abnormal results are displayed) Labs Reviewed  URINALYSIS, ROUTINE W REFLEX MICROSCOPIC - Abnormal; Notable for the following components:  Result Value   Specific Gravity, Urine 1.004 (*)    Hgb urine dipstick LARGE (*)    Protein, ur 30 (*)    All other components within normal limits    EKG None  Radiology No results found.  Procedures Procedures    Medications Ordered in ED Medications - No data to display  ED Course/ Medical Decision Making/ A&P                                 Medical Decision Making Complains of blood in his urine he is not having any back pain or abdominal pain  Amount and/or Complexity of Data Reviewed Labs: ordered. Decision-making details documented in ED Course.    Details: As ordered reviewed and interpreted UA shows hemoglobin Radiology: ordered and independent interpretation performed. Decision-making  details documented in ED Course.    Details: CT renal shows multiple months.  Patient has 2 punctate renal stones, a left ureteral stone and 2 right ureteral stones  Risk Prescription drug management. Risk Details: Patient is counseled on findings.  Patient is currently pain-free I will start him on Flomax.  Patient is given a prescription for Percocet and Zofran.  I have advised him stones should pass.  He is advised to the return to the emergency department if significant pain.  Patient is advised to follow-up with [facility] urology for further evaluation.           Final Clinical Impression(s) / ED Diagnoses Final diagnoses:  Hematuria, unspecified type  Kidney stones    Rx / DC Orders ED Discharge Orders          Ordered    oxyCODONE-acetaminophen (PERCOCET) 5-325 MG tablet  Every 4 hours PRN        09/06/23 1406    ondansetron (ZOFRAN-ODT) 4 MG disintegrating tablet  Every 8 hours PRN        09/06/23 1406    tamsulosin (FLOMAX) 0.4 MG CAPS capsule  Daily        09/06/23 1406           An After Visit Summary was printed and given to the patient.    Elson Areas, [place] 09/08/23 1325    Bethann Berkshire, MD 09/13/23 1131

## 2023-10-04 ENCOUNTER — Ambulatory Visit (INDEPENDENT_AMBULATORY_CARE_PROVIDER_SITE_OTHER): Payer: Medicare Other | Admitting: Urology

## 2023-10-04 ENCOUNTER — Encounter: Payer: Self-pay | Admitting: Urology

## 2023-10-04 VITALS — BP 141/93 | HR 108

## 2023-10-04 DIAGNOSIS — N200 Calculus of kidney: Secondary | ICD-10-CM

## 2023-10-04 DIAGNOSIS — N201 Calculus of ureter: Secondary | ICD-10-CM

## 2023-10-04 LAB — URINALYSIS, ROUTINE W REFLEX MICROSCOPIC
Bilirubin, UA: NEGATIVE
Glucose, UA: NEGATIVE
Ketones, UA: NEGATIVE
Leukocytes,UA: NEGATIVE
Nitrite, UA: NEGATIVE
Protein,UA: NEGATIVE
RBC, UA: NEGATIVE
Specific Gravity, UA: 1.03 (ref 1.005–1.030)
Urobilinogen, Ur: 0.2 mg/dL (ref 0.2–1.0)
pH, UA: 6 (ref 5.0–7.5)

## 2023-10-04 NOTE — Progress Notes (Signed)
10/04/2023 7:06 AM   Cory Davis 05-14-1991 [id number]  Referring provider:   No chief complaint on file.   HPI: 32 year old male comes in today for first urologic visit.  He was diagnosed with bilateral ureteral stones on 09/06/2023.  He presented more with gross hematuria than anything else.  He passed 3 or 4 stones shortly after his emergency room visit.  He is no longer having any symptoms or gross hematuria.  He does have a history of passing multiple stones previously.  Apparently he had a 24-hour urine and stone analysis done around 2010.  Stones are apparently calcium.    PMH: Past Medical History:  Diagnosis Date   Chronic back pain    Chronic knee pain    Complication of anesthesia    sts, " I woke up during surgery and sat up".   History of kidney stones    Prediabetes    Renal disorder    Sciatica     Surgical History: Past Surgical History:  Procedure Laterality Date   ANTERIOR CRUCIATE LIGAMENT REPAIR Right    ANTERIOR CRUCIATE LIGAMENT REPAIR Right 04/10/2019   Procedure: RIGHT ANTERIOR CRUCIATE LIGAMENT (ACL) REVISION REPAIR WITH ALLOGRAFT;  Surgeon: Vickki Hearing, MD;  Location: [facility];  Service: Orthopedics;  Laterality: Right;   KNEE ARTHROPLASTY Right    broken knee cap    Home Medications:  Allergies as of 10/04/2023   No Known Allergies      Medication List        Accurate as of October 04, 2023  7:06 AM. If you have any questions, ask your nurse or doctor.          aspirin 81 MG chewable tablet Chew 1 tablet by mouth daily.   buPROPion 150 MG 12 hr tablet Commonly known as: WELLBUTRIN SR Take 150 mg by mouth 2 (two) times daily.   busPIRone 30 MG tablet Commonly known as: BUSPAR Take 30 mg by mouth daily.   ondansetron 4 MG disintegrating tablet Commonly known as: ZOFRAN-ODT Take 1 tablet (4 mg total) by mouth every 8 (eight) hours as needed for nausea or vomiting.   oxyCODONE-acetaminophen 5-325 MG  tablet Commonly known as: Percocet Take 1 tablet by mouth every 4 (four) hours as needed for severe pain (pain score 7-10).   Rexulti 2 MG Tabs tablet Generic drug: brexpiprazole Take 2 mg by mouth daily.   tamsulosin 0.4 MG Caps capsule Commonly known as: FLOMAX Take 1 capsule (0.4 mg total) by mouth daily.        Allergies: No Known Allergies  Family History: Family History  Problem Relation Age of Onset   Diabetes Other    Diabetes Mother    Hypertension Father    Cancer Sister    Diabetes Maternal Grandfather    Hypertension Maternal Grandfather     Social History:  reports that he has been smoking cigarettes. He has a 5 pack-year smoking history. He has quit using smokeless tobacco. He reports that he does not drink alcohol and does not use drugs.  ROS: All other review of systems were reviewed and are negative except what is noted above in HPI  Physical Exam: There were no vitals taken for this visit.  Constitutional:  Alert and oriented, No acute distress. HEENT: [place] AT, moist mucus membranes.  Trachea midline, no masses. Cardiovascular: No clubbing, cyanosis, or edema. Respiratory: Normal respiratory effort, no increased work of breathing. Neurologic: Grossly intact, no focal deficits, moving all 4 extremities.  Psychiatric: Normal mood and affect.  Laboratory Data: Lab Results  Component Value Date   WBC 12.1 (H) 03/05/2020   HGB 15.0 (A) 04/01/2020   HCT 38.6 (L) 03/05/2020   MCV 85.4 03/05/2020   PLT 328 03/05/2020    Lab Results  Component Value Date   CREATININE 0.83 03/05/2020    No results found for: "PSA"  No results found for: "TESTOSTERONE"  No results found for: "HGBA1C"  Urinalysis    Component Value Date/Time   COLORURINE YELLOW 09/06/2023 1237   APPEARANCEUR CLEAR 09/06/2023 1237   LABSPEC 1.004 (L) 09/06/2023 1237   PHURINE 8.0 09/06/2023 1237   GLUCOSEU NEGATIVE 09/06/2023 1237   HGBUR LARGE (A) 09/06/2023 1237    BILIRUBINUR NEGATIVE 09/06/2023 1237   KETONESUR NEGATIVE 09/06/2023 1237   PROTEINUR 30 (A) 09/06/2023 1237   NITRITE NEGATIVE 09/06/2023 1237   LEUKOCYTESUR NEGATIVE 09/06/2023 1237    Lab Results  Component Value Date   BACTERIA NONE SEEN 09/06/2023      Results for orders placed during the hospital encounter of 09/06/23  CT Renal Stone Study  Narrative CLINICAL DATA:  Hematuria, flank pain  EXAM: CT ABDOMEN AND PELVIS WITHOUT CONTRAST  TECHNIQUE: Multidetector CT imaging of the abdomen and pelvis was performed following the standard protocol without IV contrast.  RADIATION DOSE REDUCTION: This exam was performed according to the departmental dose-optimization program which includes automated exposure control, adjustment of the mA and/or kV according to patient size and/or use of iterative reconstruction technique.  COMPARISON:  02/08/2016  FINDINGS: Lower chest: No acute abnormality  Hepatobiliary: Diffuse low-density throughout the liver compatible with fatty infiltration. No focal abnormality. Gallbladder unremarkable.  Pancreas: No focal abnormality or ductal dilatation.  Spleen: No focal abnormality.  Normal size.  Adrenals/Urinary Tract: Adrenal glands normal. Bilateral punctate nephrolithiasis, the largest in the left midpole and lower pole measuring 2 mm. 5 mm proximal left ureteral stone. No hydronephrosis 2 mm stone in the mid right ureter near the pelvic brim. No hydronephrosis. 5 mm distal right ureteral stone at the UVJ with slight fullness of the distal right ureter. Urinary bladder decompressed, unremarkable.  Stomach/Bowel: Normal appendix. Stomach, large and small bowel grossly unremarkable.  Vascular/Lymphatic: No evidence of aneurysm or adenopathy. Aortic atherosclerosis.  Reproductive: No visible focal abnormality.  Other: No free fluid or free air.  Musculoskeletal: No acute bony abnormality.  IMPRESSION: 5 mm proximal left  ureteral stone without hydronephrosis.  2 mm mid right ureteral stone and 5 mm distal right ureteral stone. No hydronephrosis. Slight fullness of the distal right ureter.  Bilateral nephrolithiasis.  Hepatic steatosis.  Aortic atherosclerosis, advanced for patient's age. Recommend correlation with cardiovascular risk factors.   Electronically Signed By: Charlett Nose M.D. On: 09/06/2023 13:24  I reviewed CT images with the patient-about 7 very small stones remain in each kidney.  Urinalysis reviewed  Prior emergency room records reviewed   Assessmen   1.  History of ureteral calculi, most likely passed these  2.  Remaining small bilateral renal calculi  Plan:  1.  I sent him out with a 24-hour urine request.  I will send him results through MyChart  2.  Stone analysis  3.  Follow-up dependent on above  [facility] [place]

## 2023-10-10 LAB — CALCULI, WITH PHOTOGRAPH (CLINICAL LAB)
Calcium Oxalate Dihydrate: 40 %
Calcium Oxalate Monohydrate: 60 %
Weight Calculi: 52 mg
